# Patient Record
Sex: Female | Born: 1935 | Race: White | Hispanic: No | Smoking: Never smoker
Health system: Southern US, Community
[De-identification: ages and names within clinical notes are randomized; demographics above are authoritative.]

## PROBLEM LIST (undated history)

## (undated) DIAGNOSIS — C801 Malignant (primary) neoplasm, unspecified: Secondary | ICD-10-CM

## (undated) DIAGNOSIS — R0600 Dyspnea, unspecified: Secondary | ICD-10-CM

## (undated) DIAGNOSIS — N959 Unspecified menopausal and perimenopausal disorder: Secondary | ICD-10-CM

## (undated) DIAGNOSIS — K635 Polyp of colon: Secondary | ICD-10-CM

## (undated) DIAGNOSIS — E785 Hyperlipidemia, unspecified: Secondary | ICD-10-CM

## (undated) DIAGNOSIS — I10 Essential (primary) hypertension: Secondary | ICD-10-CM

## (undated) DIAGNOSIS — E559 Vitamin D deficiency, unspecified: Secondary | ICD-10-CM

## (undated) DIAGNOSIS — M1990 Unspecified osteoarthritis, unspecified site: Secondary | ICD-10-CM

## (undated) DIAGNOSIS — F0280 Dementia in other diseases classified elsewhere without behavioral disturbance: Secondary | ICD-10-CM

## (undated) DIAGNOSIS — N3090 Cystitis, unspecified without hematuria: Secondary | ICD-10-CM

## (undated) DIAGNOSIS — J302 Other seasonal allergic rhinitis: Secondary | ICD-10-CM

## (undated) DIAGNOSIS — G20 Parkinson's disease: Secondary | ICD-10-CM

## (undated) DIAGNOSIS — R2681 Unsteadiness on feet: Secondary | ICD-10-CM

## (undated) DIAGNOSIS — K5730 Diverticulosis of large intestine without perforation or abscess without bleeding: Secondary | ICD-10-CM

## (undated) DIAGNOSIS — R251 Tremor, unspecified: Secondary | ICD-10-CM

## (undated) DIAGNOSIS — R413 Other amnesia: Secondary | ICD-10-CM

## (undated) DIAGNOSIS — K5909 Other constipation: Secondary | ICD-10-CM

## (undated) HISTORY — DX: Malignant (primary) neoplasm, unspecified: C80.1

## (undated) HISTORY — DX: Other constipation: K59.09

## (undated) HISTORY — DX: Unsteadiness on feet: R26.81

## (undated) HISTORY — DX: Dementia in other diseases classified elsewhere, unspecified severity, without behavioral disturbance, psychotic disturbance, mood disturbance, and anxiety: F02.80

## (undated) HISTORY — DX: Unspecified osteoarthritis, unspecified site: M19.90

## (undated) HISTORY — DX: Cystitis, unspecified without hematuria: N30.90

## (undated) HISTORY — DX: Vitamin D deficiency, unspecified: E55.9

## (undated) HISTORY — DX: Dyspnea, unspecified: R06.00

## (undated) HISTORY — DX: Other amnesia: R41.3

## (undated) HISTORY — DX: Polyp of colon: K63.5

## (undated) HISTORY — DX: Tremor, unspecified: R25.1

## (undated) HISTORY — DX: Hyperlipidemia, unspecified: E78.5

## (undated) HISTORY — DX: Parkinson's disease: G20

## (undated) HISTORY — DX: Other seasonal allergic rhinitis: J30.2

## (undated) HISTORY — DX: Unspecified menopausal and perimenopausal disorder: N95.9

## (undated) HISTORY — DX: Diverticulosis of large intestine without perforation or abscess without bleeding: K57.30

## (undated) HISTORY — PX: ABDOMINAL HYSTERECTOMY: SHX81

## (undated) HISTORY — DX: Essential (primary) hypertension: I10

---

## 2006-07-26 ENCOUNTER — Ambulatory Visit: Payer: Self-pay | Admitting: Family Medicine

## 2006-08-09 ENCOUNTER — Ambulatory Visit: Payer: Self-pay | Admitting: General Surgery

## 2007-08-30 ENCOUNTER — Ambulatory Visit: Payer: Self-pay | Admitting: General Surgery

## 2012-06-21 ENCOUNTER — Ambulatory Visit: Payer: Self-pay | Admitting: Neurology

## 2013-02-14 ENCOUNTER — Ambulatory Visit: Payer: Self-pay | Admitting: Family Medicine

## 2013-03-07 ENCOUNTER — Ambulatory Visit: Payer: Self-pay | Admitting: Family Medicine

## 2013-07-15 ENCOUNTER — Encounter: Payer: Self-pay | Admitting: Pulmonary Disease

## 2013-07-15 ENCOUNTER — Ambulatory Visit (INDEPENDENT_AMBULATORY_CARE_PROVIDER_SITE_OTHER): Payer: Medicare Other | Admitting: Pulmonary Disease

## 2013-07-15 VITALS — BP 132/84 | HR 62 | Ht 59.0 in | Wt 158.0 lb

## 2013-07-15 DIAGNOSIS — J8410 Pulmonary fibrosis, unspecified: Secondary | ICD-10-CM

## 2013-07-15 DIAGNOSIS — J849 Interstitial pulmonary disease, unspecified: Secondary | ICD-10-CM

## 2013-07-15 DIAGNOSIS — J84111 Idiopathic interstitial pneumonia, not otherwise specified: Secondary | ICD-10-CM

## 2013-07-15 NOTE — Progress Notes (Signed)
Subjective:    Patient ID: Cynthia Patterson, female    DOB: 05/18/1928, 79 y.o.   MRN: [id number]  HPI  This is a very pleasant 79 year old female who comes to our clinic today for evaluation of idiopathic interstitial pneumonitis. She states that she never smoked cigarettes and she's never been told that she has any respiratory problems. She had a normal childhood and has never had significant shortness of breath over the years. However in the last couple years she has had a cough which is typically worse at night. Usually she'll note it when she gets up in the middle the night to go to the bathroom. She does sometimes feel some shortness of breath when she gets up in the middle the night. She sometimes feels hoarse afterwards. Shortness of breath is usually little worse when she lays down flat at night. She's had chronic ankle swelling which hasn't changed much in the last several years. Despite the nighttime dyspnea she really doesn't feel much shortness of breath during the day. She says that she goes to the grocery store cleans the house and is capable of carrying heavy objects about feeling too short of breath. She doesn't climb stairs very often. She can run a vacuum cleaner without having shortness of breath.   Her primary care physician noted crackles on physical exam it's ordered a chest x-ray which showed increased interstitial changes. A repeat chest x-ray was performed approximately one month later and showed that the interstitial changes were perhaps more prominent so therefore the patient was referred to [place] for further evaluation.  She doesn't have trouble swallowing and has not noted a new rash or joint problems.   Past Medical History  Diagnosis Date  . Tremor   . Dyspnea   . Unsteady gait   . Seasonal allergies   . Memory loss   . Diverticulosis of colon   . Arthritis   . Cystitis   . Vitamin D deficiency   . Colon polyp   . Cancer   . Hypertension   . Hyperlipidemia   .  Menopausal disorder      Family History  Problem Relation Age of Onset  . Heart failure Mother   . Lung cancer Father   . Hypertension Sister   . Cancer Paternal Grandmother      History   Social History  . Marital Status: Married    Spouse Name: N/A    Number of Children: N/A  . Years of Education: N/A   Occupational History  . Not on file.   Social History Main Topics  . Smoking status: Never Smoker   . Smokeless tobacco: Never Used  . Alcohol Use: No  . Drug Use: No  . Sexual Activity: Not on file   Other Topics Concern  . Not on file   Social History Narrative  . No narrative on file     Allergies  Allergen Reactions  . Lisinopril   . Norvasc [Amlodipine Besylate]      No outpatient prescriptions prior to visit.   No facility-administered medications prior to visit.      Review of Systems  Constitutional: Negative for fever, chills, diaphoresis, activity change, appetite change, fatigue and unexpected weight change.  HENT: Positive for dental problem. Negative for hearing loss, ear pain, nosebleeds, congestion, sore throat, facial swelling, rhinorrhea, sneezing, mouth sores, trouble swallowing, neck pain, neck stiffness, voice change, postnasal drip, sinus pressure, tinnitus and ear discharge.   Eyes: Negative for photophobia, discharge,  itching and visual disturbance.  Respiratory: Positive for cough and shortness of breath. Negative for apnea, choking, chest tightness, wheezing and stridor.   Cardiovascular: Negative for chest pain, palpitations and leg swelling.  Gastrointestinal: Negative for nausea, vomiting, abdominal pain, constipation, blood in stool and abdominal distention.  Genitourinary: Negative for dysuria, urgency, frequency, hematuria, flank pain, decreased urine volume and difficulty urinating.  Musculoskeletal: Negative for myalgias, back pain, joint swelling, arthralgias and gait problem.  Skin: Negative for color change, pallor and  rash.  Neurological: Negative for dizziness, tremors, seizures, syncope, speech difficulty, weakness, light-headedness, numbness and headaches.  Hematological: Negative for adenopathy. Does not bruise/bleed easily.  Psychiatric/Behavioral: Negative for confusion, sleep disturbance and agitation. The patient is nervous/anxious.        Objective:   Physical Exam  Filed Vitals:   07/15/13 1617  BP: 132/84  Pulse: 62  Height: 4\' 11"  (1.499 m)  Weight: 158 lb (71.668 kg)  SpO2: 97%   Did not desaturate after walking 569feet in the office today on RA   Gen: well appearing, no acute distress HEENT: NCAT, PERRL, EOMi, OP clear, neck supple without masses PULM: Crackles in lung bases bilaterally CV: RRR, slight systolic murmur, no JVD AB: BS+, soft, nontender, no hsm Ext: warm, ankle edema bilaterally, no clubbing, no cyanosis Derm: no rash or skin breakdown Neuro: A&Ox4, CN II-XII intact, strength 5/5 in all 4 extremities      Assessment & Plan:   Idiopathic interstitial pneumonia The combination of inspiratory crackles and an abnormal chest x-ray to make me concerned for interstitial lung disease. The differential diagnosis would include hydrostatic pulmonary edema from a cardiac cause, however she appears to be euvolemic on physical exam today.  The next up in her workup should be further diagnostic imaging and pulmonary function testing to further clarify what is going on.   Plan: -CT chest, ILD protocol in [facility] -Full pulmonary function testing -If there is no evidence of interstitial lung disease then would order an echocardiogram prior to the next visit    Updated Medication List Outpatient Encounter Prescriptions as of 07/15/2013  Medication Sig Dispense Refill  . Ascorbic Acid (VITAMIN C) 100 MG tablet Take 100 mg by mouth daily.      Marland Kitchen aspirin 81 MG tablet Take 81 mg by mouth daily.      Marland Kitchen CALCIUM-MAGNESIUM-ZINC PO Take 1 tablet by mouth daily.      .  cholecalciferol (VITAMIN D) 1000 UNITS tablet Take 1,000 Units by mouth daily.      Marland Kitchen losartan-hydrochlorothiazide (HYZAAR) 100-25 MG per tablet Take 1 tablet by mouth daily.      Marland Kitchen lovastatin (MEVACOR) 40 MG tablet Take 40 mg by mouth at bedtime.      . nebivolol (BYSTOLIC) 5 MG tablet Take 5 mg by mouth daily.      . niacin (NIASPAN) 500 MG CR tablet Take 1,000 mg by mouth at bedtime.      . Selenium 100 MCG TABS Take 1 tablet by mouth daily.      . vitamin B-12 (CYANOCOBALAMIN) 100 MCG tablet Take 50 mcg by mouth daily.       No facility-administered encounter medications on file as of 07/15/2013.

## 2013-07-15 NOTE — Assessment & Plan Note (Addendum)
The combination of inspiratory crackles and an abnormal chest x-ray to make me concerned for interstitial lung disease. The differential diagnosis would include hydrostatic pulmonary edema from a cardiac cause, however she appears to be euvolemic on physical exam today.  The next up in her workup should be further diagnostic imaging and pulmonary function testing to further clarify what is going on.   Plan: -CT chest, ILD protocol in [facility] -Full pulmonary function testing -If there is no evidence of interstitial lung disease then would order an echocardiogram prior to the next visit

## 2013-07-15 NOTE — Patient Instructions (Signed)
We will order a CT scan of your chest to evaluate the crackles and possible scarring in your lungs (in [place]) We will set up pulmonary function testing for you at [facility] We will see you back in 2-3 weeks or sooner if needed

## 2013-07-25 ENCOUNTER — Ambulatory Visit (HOSPITAL_COMMUNITY)
Admission: RE | Admit: 2013-07-25 | Discharge: 2013-07-25 | Disposition: A | Discharge status: Home / Self Care | Payer: Medicare Other | Source: Ambulatory Visit | Attending: Pulmonary Disease | Admitting: Pulmonary Disease

## 2013-07-25 ENCOUNTER — Ambulatory Visit (INDEPENDENT_AMBULATORY_CARE_PROVIDER_SITE_OTHER)
Admission: RE | Admit: 2013-07-25 | Discharge: 2013-07-25 | Disposition: A | Discharge status: Home / Self Care | Payer: Medicare Other | Source: Ambulatory Visit | Attending: Pulmonary Disease | Admitting: Pulmonary Disease

## 2013-07-25 DIAGNOSIS — J8410 Pulmonary fibrosis, unspecified: Secondary | ICD-10-CM | POA: Insufficient documentation

## 2013-07-25 DIAGNOSIS — J84111 Idiopathic interstitial pneumonia, not otherwise specified: Secondary | ICD-10-CM

## 2013-07-25 DIAGNOSIS — J849 Interstitial pulmonary disease, unspecified: Secondary | ICD-10-CM

## 2013-07-25 LAB — PULMONARY FUNCTION TEST

## 2013-07-25 MED ORDER — ALBUTEROL SULFATE (5 MG/ML) 0.5% IN NEBU
2.5000 mg | INHALATION_SOLUTION | Freq: Once | RESPIRATORY_TRACT | Status: AC
  Administered 2013-07-25: 2.5 mg via RESPIRATORY_TRACT

## 2013-07-28 ENCOUNTER — Telehealth: Payer: Self-pay | Admitting: *Deleted

## 2013-07-28 ENCOUNTER — Encounter: Payer: Self-pay | Admitting: Pulmonary Disease

## 2013-07-28 DIAGNOSIS — R0602 Shortness of breath: Secondary | ICD-10-CM

## 2013-07-28 NOTE — Telephone Encounter (Signed)
Pt is aware of CT results. Echo will be ordered.

## 2013-07-28 NOTE — Telephone Encounter (Signed)
Message copied by Caryl Ada on Mon Jul 28, 2013  9:18 AM ------      Message from: Max Fickle B      Created: Mon Jul 28, 2013  9:13 AM       L,            Please let her know that her CT did not show scarring or fibrosis and that we need to order an echocardiogram to evaluate her shortness of breath.            Thanks,      B ------

## 2013-08-11 ENCOUNTER — Ambulatory Visit: Payer: Self-pay | Admitting: Ophthalmology

## 2013-08-11 LAB — POTASSIUM: Potassium: 3.8 mmol/L (ref 3.5–5.1)

## 2013-08-14 ENCOUNTER — Telehealth: Payer: Self-pay | Admitting: Pulmonary Disease

## 2013-08-14 NOTE — Telephone Encounter (Signed)
Pt last visit on 07-15-13. Received a call from Alvino Chapel from [facility]. The pt is scheduled for cataract surgery on 08-19-13 with topical, local anesthesia. I see at last visit you ordered PFT and CT before follow-up. CT results are in epic, and PFT was done on 08-09-13. Please advise on surgical clearance, does the pt need an OV before? Carron Curie, CMA Allergies  Allergen Reactions  . Lisinopril   . Norvasc [Amlodipine Besylate]

## 2013-08-14 NOTE — Telephone Encounter (Signed)
OK for surgery if topical, local anesthesia

## 2013-08-15 ENCOUNTER — Other Ambulatory Visit: Payer: Self-pay

## 2013-08-15 ENCOUNTER — Telehealth: Payer: Self-pay | Admitting: Pulmonary Disease

## 2013-08-15 ENCOUNTER — Other Ambulatory Visit (INDEPENDENT_AMBULATORY_CARE_PROVIDER_SITE_OTHER): Payer: Medicare Other

## 2013-08-15 ENCOUNTER — Encounter: Payer: Self-pay | Admitting: *Deleted

## 2013-08-15 DIAGNOSIS — R0609 Other forms of dyspnea: Secondary | ICD-10-CM

## 2013-08-15 DIAGNOSIS — R0602 Shortness of breath: Secondary | ICD-10-CM

## 2013-08-15 NOTE — Telephone Encounter (Signed)
Spoke with Alvino Chapel at [facility] I have notified her of recs per BQ She verbalized understanding Letter was faxed to her at [phone]  Nothing further needed

## 2013-08-15 NOTE — Telephone Encounter (Signed)
Results have not even showed up yet, the pt just had this done an hour ago Dr. Kendrick Fries, please advise once you have reviewed so that we can fax this to them  Thanks!

## 2013-08-17 ENCOUNTER — Encounter: Payer: Self-pay | Admitting: Pulmonary Disease

## 2013-08-17 NOTE — Telephone Encounter (Signed)
Will do, thanks

## 2013-08-18 ENCOUNTER — Telehealth: Payer: Self-pay | Admitting: Pulmonary Disease

## 2013-08-18 NOTE — Telephone Encounter (Addendum)
Notes Recorded by Lupita Leash, MD on 08/17/2013 at 3:42 PM L,  Please let her know that her echo was essentially normal. There were a few minor abnormalities which are hard to describe on the phone and we will discuss it when she comes in next.  Thanks, B   Pt advised of results. Carron Curie, CMA

## 2013-08-19 ENCOUNTER — Ambulatory Visit: Payer: Self-pay | Admitting: Ophthalmology

## 2013-08-19 ENCOUNTER — Encounter: Payer: Self-pay | Admitting: Pulmonary Disease

## 2013-08-19 NOTE — Telephone Encounter (Signed)
Spoke with Melissa at [facility] in [place].  Pt is scheduled to have a cataract extration.  They are needing the echo results.  I have faxed this to the verified fax #.  Melissa aware.

## 2013-09-02 ENCOUNTER — Ambulatory Visit: Payer: Self-pay | Admitting: Ophthalmology

## 2015-01-15 DIAGNOSIS — I951 Orthostatic hypotension: Secondary | ICD-10-CM | POA: Diagnosis not present

## 2015-01-15 DIAGNOSIS — L821 Other seborrheic keratosis: Secondary | ICD-10-CM | POA: Diagnosis not present

## 2015-01-15 DIAGNOSIS — I10 Essential (primary) hypertension: Secondary | ICD-10-CM | POA: Diagnosis not present

## 2015-01-15 DIAGNOSIS — E785 Hyperlipidemia, unspecified: Secondary | ICD-10-CM | POA: Diagnosis not present

## 2015-02-26 NOTE — Op Note (Signed)
PATIENT NAME:  Cynthia Patterson, Cynthia Patterson MR#:  [id number] DATE OF BIRTH:  [birth year]  DATE OF PROCEDURE:  09/02/2013  PREOPERATIVE DIAGNOSIS: Visually significant cataract of the right eye.   POSTOPERATIVE DIAGNOSIS: Visually significant cataract of the right eye.   OPERATIVE PROCEDURE: Cataract extraction by phacoemulsification with implant of intraocular lens to right eye.   SURGEON: Birder Robson, MD.   ANESTHESIA:  1. Managed anesthesia care.  2. Topical tetracaine drops followed by 2% Xylocaine jelly applied in the preoperative holding area.   COMPLICATIONS: None.   TECHNIQUE:   Stop and chop.   DESCRIPTION OF PROCEDURE: The patient was examined and consented in the preoperative holding area where the aforementioned topical anesthesia was applied to the right eye and then brought back to the Operating Room where the right eye was prepped and draped in the usual sterile ophthalmic fashion and a lid speculum was placed. A paracentesis was created with the side port blade and the anterior chamber was filled with viscoelastic. A near clear corneal incision was performed with the steel keratome. A continuous curvilinear capsulorrhexis was performed with a cystotome followed by the capsulorrhexis forceps. Hydrodissection and hydrodelineation were carried out with BSS on a blunt cannula. The lens was removed in a  Stop and chop technique and the remaining cortical material was removed with the irrigation-aspiration handpiece. The capsular bag was inflated with viscoelastic and the Tecnis ZCB00 24.0-diopter lens, serial #[id number], was placed in the capsular bag without complication. The remaining viscoelastic was removed from the eye with the irrigation-aspiration handpiece. The wounds were hydrated. The anterior chamber was flushed with Miostat and the eye was inflated to physiologic pressure. 0.1 mL of cefuroxime concentration 10 mg/mL was placed in the anterior chamber. The wounds were found to be water  tight. The eye was dressed with Vigamox. The patient was given protective glasses to wear throughout the day and a shield with which to sleep tonight. The patient was also given drops with which to begin a drop regimen today and will follow-up with me in one day.    ____________________________ Livingston Diones. Kecia Swoboda, MD wlp:np D: 09/02/2013 18:42:08 ET T: 09/02/2013 19:39:52 ET JOB#: [id number]  cc: Ricardo Kayes L. Katherine Tout, MD, <Dictator> Livingston Diones [place] Severs MD ELECTRONICALLY SIGNED 09/08/2013 10:30

## 2015-02-26 NOTE — Op Note (Signed)
PATIENT NAME:  Cynthia Patterson, MACLIN MR#:  [id number] DATE OF BIRTH:  1928-02-02  DATE OF PROCEDURE:  08/19/2013  PREOPERATIVE DIAGNOSIS: Visually significant cataract of the left eye.   POSTOPERATIVE DIAGNOSIS: Visually significant cataract of the left eye.   OPERATIVE PROCEDURE: Cataract extraction by phacoemulsification with implant of intraocular lens to left eye.   SURGEON: Birder Robson, MD.   ANESTHESIA:  1. Managed anesthesia care.  2. Topical tetracaine drops followed by 2% Xylocaine jelly applied in the preoperative holding area.   COMPLICATIONS: None.   TECHNIQUE:  Stop and chop.  DESCRIPTION OF PROCEDURE: The patient was examined and consented in the preoperative holding area where the aforementioned topical anesthesia was applied to the left eye and then brought back to the Operating Room where the left eye was prepped and draped in the usual sterile ophthalmic fashion and a lid speculum was placed. A paracentesis was created with the side port blade and the anterior chamber was filled with viscoelastic. A near clear corneal incision was performed with the steel keratome. A continuous curvilinear capsulorrhexis was performed with a cystotome followed by the capsulorrhexis forceps. Hydrodissection and hydrodelineation were carried out with BSS on a blunt cannula. The lens was removed in a stop and chop technique and the remaining cortical material was removed with the irrigation-aspiration handpiece. The capsular bag was inflated with viscoelastic and the Tecnis ZCB00 25.5-diopter lens, serial number [id number] was placed in the capsular bag without complication. The remaining viscoelastic was removed from the eye with the irrigation-aspiration handpiece. The wounds were hydrated. The anterior chamber was flushed with Miostat and the eye was inflated to physiologic pressure. 0.1 mL of cefuroxime concentration 10 mg/mL was placed in the anterior chamber. The wounds were found to be water  tight. The eye was dressed with Vigamox. The patient was given protective glasses to wear throughout the day and a shield with which to sleep tonight. The patient was also given drops with which to begin a drop regimen today and will follow-up with me in one day.     ____________________________ Livingston Diones. Aldahir Litaker, MD wlp:ms D: 08/19/2013 16:58:09 ET T: 08/19/2013 18:38:49 ET JOB#: [id number]  cc: Casaundra Takacs L. Gillian Kluever, MD, <Dictator> Livingston Diones Raniah Karan MD ELECTRONICALLY SIGNED 08/25/2013 13:18

## 2015-04-26 ENCOUNTER — Other Ambulatory Visit: Payer: Self-pay | Admitting: Family Medicine

## 2015-04-26 DIAGNOSIS — I10 Essential (primary) hypertension: Secondary | ICD-10-CM

## 2015-05-26 ENCOUNTER — Other Ambulatory Visit: Payer: Self-pay | Admitting: Family Medicine

## 2015-05-26 DIAGNOSIS — I10 Essential (primary) hypertension: Secondary | ICD-10-CM

## 2015-05-29 ENCOUNTER — Other Ambulatory Visit: Payer: Self-pay | Admitting: Family Medicine

## 2015-05-29 DIAGNOSIS — I10 Essential (primary) hypertension: Secondary | ICD-10-CM

## 2015-06-26 ENCOUNTER — Other Ambulatory Visit: Payer: Self-pay | Admitting: Family Medicine

## 2015-07-22 ENCOUNTER — Encounter: Payer: Self-pay | Admitting: Family Medicine

## 2015-08-31 ENCOUNTER — Other Ambulatory Visit: Payer: Self-pay | Admitting: Family Medicine

## 2015-08-31 DIAGNOSIS — I10 Essential (primary) hypertension: Secondary | ICD-10-CM

## 2015-08-31 NOTE — Telephone Encounter (Signed)
Last OV 01/15/2015; overdue for AWV. Renaldo Fiddler, CMA

## 2015-10-05 ENCOUNTER — Other Ambulatory Visit: Payer: Self-pay | Admitting: Family Medicine

## 2015-10-05 DIAGNOSIS — I10 Essential (primary) hypertension: Secondary | ICD-10-CM

## 2015-10-18 ENCOUNTER — Other Ambulatory Visit: Payer: Self-pay | Admitting: Family Medicine

## 2015-10-18 DIAGNOSIS — I10 Essential (primary) hypertension: Secondary | ICD-10-CM

## 2015-10-18 NOTE — Telephone Encounter (Signed)
Last OV 01/2015  Thanks,   -Fatimata Talsma  

## 2015-11-01 ENCOUNTER — Other Ambulatory Visit: Payer: Self-pay | Admitting: Family Medicine

## 2015-11-04 ENCOUNTER — Other Ambulatory Visit: Payer: Self-pay | Admitting: Family Medicine

## 2015-11-04 DIAGNOSIS — I10 Essential (primary) hypertension: Secondary | ICD-10-CM

## 2015-11-05 NOTE — Telephone Encounter (Signed)
LOV 01/15/2015

## 2016-01-26 ENCOUNTER — Other Ambulatory Visit: Payer: Self-pay | Admitting: Family Medicine

## 2016-01-26 DIAGNOSIS — I10 Essential (primary) hypertension: Secondary | ICD-10-CM

## 2016-02-23 ENCOUNTER — Other Ambulatory Visit: Payer: Self-pay | Admitting: Family Medicine

## 2016-02-23 DIAGNOSIS — I10 Essential (primary) hypertension: Secondary | ICD-10-CM

## 2016-02-23 NOTE — Telephone Encounter (Signed)
01/15/15 last ov

## 2016-03-21 ENCOUNTER — Other Ambulatory Visit: Payer: Self-pay | Admitting: Family Medicine

## 2016-03-21 DIAGNOSIS — I10 Essential (primary) hypertension: Secondary | ICD-10-CM

## 2016-04-15 ENCOUNTER — Other Ambulatory Visit: Payer: Self-pay | Admitting: Family Medicine

## 2016-04-15 DIAGNOSIS — E785 Hyperlipidemia, unspecified: Secondary | ICD-10-CM

## 2016-04-29 ENCOUNTER — Other Ambulatory Visit: Payer: Self-pay | Admitting: Family Medicine

## 2016-05-14 ENCOUNTER — Other Ambulatory Visit: Payer: Self-pay | Admitting: Family Medicine

## 2016-08-23 ENCOUNTER — Other Ambulatory Visit: Payer: Self-pay | Admitting: Family Medicine

## 2016-08-23 DIAGNOSIS — I10 Essential (primary) hypertension: Secondary | ICD-10-CM

## 2016-09-07 ENCOUNTER — Other Ambulatory Visit: Payer: Self-pay | Admitting: Internal Medicine

## 2016-09-07 DIAGNOSIS — N184 Chronic kidney disease, stage 4 (severe): Secondary | ICD-10-CM

## 2016-09-22 ENCOUNTER — Other Ambulatory Visit: Payer: Self-pay | Admitting: Family Medicine

## 2016-09-22 DIAGNOSIS — I10 Essential (primary) hypertension: Secondary | ICD-10-CM

## 2016-10-26 ENCOUNTER — Other Ambulatory Visit: Payer: Self-pay | Admitting: Family Medicine

## 2016-10-26 DIAGNOSIS — I10 Essential (primary) hypertension: Secondary | ICD-10-CM

## 2016-11-09 ENCOUNTER — Other Ambulatory Visit: Payer: Self-pay | Admitting: Family Medicine

## 2016-11-09 DIAGNOSIS — I10 Essential (primary) hypertension: Secondary | ICD-10-CM

## 2018-04-07 ENCOUNTER — Other Ambulatory Visit: Payer: Self-pay

## 2018-04-07 ENCOUNTER — Emergency Department: Payer: Medicare Other

## 2018-04-07 ENCOUNTER — Emergency Department
Admission: EM | Admit: 2018-04-07 | Discharge: 2018-04-07 | Disposition: A | Discharge status: Home / Self Care | Payer: Medicare Other | Attending: Emergency Medicine | Admitting: Emergency Medicine

## 2018-04-07 DIAGNOSIS — S0101XA Laceration without foreign body of scalp, initial encounter: Secondary | ICD-10-CM | POA: Insufficient documentation

## 2018-04-07 DIAGNOSIS — S0990XA Unspecified injury of head, initial encounter: Secondary | ICD-10-CM | POA: Diagnosis present

## 2018-04-07 DIAGNOSIS — Z7982 Long term (current) use of aspirin: Secondary | ICD-10-CM | POA: Diagnosis not present

## 2018-04-07 DIAGNOSIS — W2209XA Striking against other stationary object, initial encounter: Secondary | ICD-10-CM | POA: Insufficient documentation

## 2018-04-07 DIAGNOSIS — Y999 Unspecified external cause status: Secondary | ICD-10-CM | POA: Insufficient documentation

## 2018-04-07 DIAGNOSIS — Z79899 Other long term (current) drug therapy: Secondary | ICD-10-CM | POA: Diagnosis not present

## 2018-04-07 DIAGNOSIS — I10 Essential (primary) hypertension: Secondary | ICD-10-CM | POA: Insufficient documentation

## 2018-04-07 DIAGNOSIS — Y929 Unspecified place or not applicable: Secondary | ICD-10-CM | POA: Diagnosis not present

## 2018-04-07 DIAGNOSIS — Y9389 Activity, other specified: Secondary | ICD-10-CM | POA: Diagnosis not present

## 2018-04-07 NOTE — ED Provider Notes (Signed)
[facility] Emergency Department Provider Note   ____________________________________________   First MD Initiated Contact with Patient 04/07/18 [phone]     (approximate)  I have reviewed the triage vital signs and the nursing notes.   HISTORY  Chief Complaint Head Injury    HPI Cynthia Patterson is a 84 y.o. female brought to the ED from home via EMS status post mechanical fall with head injury.  Patient states she was reaching for something, lost her balance and fell, striking the corner of her wooden coffee table.  Denies LOC.  Denies use of anticoagulants.  Presents with dried blood on her left upper scalp.  Denies headache, neck pain, chest pain, shortness of breath, abdominal pain, nausea or vomiting.   Past Medical History:  Diagnosis Date  . Arthritis   . Cancer   . Colon polyp   . Cystitis   . Diverticulosis of colon   . Dyspnea   . Hyperlipidemia   . Hypertension   . Memory loss   . Menopausal disorder   . Seasonal allergies   . Tremor   . Unsteady gait   . Vitamin D deficiency     Patient Active Problem List   Diagnosis Date Noted  . Hypertension 04/26/2015  . Idiopathic interstitial pneumonia ([place]) 07/15/2013  . HLD (hyperlipidemia) 03/23/2009     Prior to Admission medications   Medication Sig Start Date End Date Taking? Authorizing Provider  Ascorbic Acid (VITAMIN C) 100 MG tablet Take 100 mg by mouth daily.    [provider]  aspirin 81 MG tablet Take 81 mg by mouth daily.    [provider]  BYSTOLIC 10 MG tablet TAKE 1 TABLET BY MOUTH ONCE A DAY 10/18/15   Margarita Rana, MD  CALCIUM-MAGNESIUM-ZINC PO Take 1 tablet by mouth daily.    [provider]  cholecalciferol (VITAMIN D) 1000 UNITS tablet Take 1,000 Units by mouth daily.    [provider]  losartan-hydrochlorothiazide (HYZAAR) 100-25 MG tablet TAKE 1 TABLET BY MOUTH EVERY DAY 09/22/16   Birdie Sons, MD  lovastatin (MEVACOR) 40  MG tablet Take 1 tablet (40 mg total) by mouth daily. Pt needs to schedule an office visit before anymore refills. 04/16/16   Margarita Rana, MD  nebivolol (BYSTOLIC) 10 MG tablet Take 1 tablet (10 mg total) by mouth daily. Pt needs to schedule an office visit before anymore refills. 03/21/16   Margarita Rana, MD  nebivolol (BYSTOLIC) 5 MG tablet Take 5 mg by mouth daily.    [provider]  niacin (NIASPAN) 500 MG CR tablet Take 1,000 mg by mouth at bedtime.    [provider]  Selenium 100 MCG TABS Take 1 tablet by mouth daily.    [provider]  vitamin B-12 (CYANOCOBALAMIN) 100 MCG tablet Take 50 mcg by mouth daily.    [provider]    Allergies Lisinopril and Norvasc [amlodipine besylate]  Family History  Problem Relation Age of Onset  . Heart failure Mother   . Lung cancer Father   . Hypertension Sister   . Cancer Paternal Grandmother     Social History Social History   Tobacco Use  . Smoking status: Never Smoker  . Smokeless tobacco: Never Used  Substance Use Topics  . Alcohol use: No  . Drug use: No    Review of Systems  Constitutional: No fever/chills Eyes: No visual changes. ENT: No sore throat. Cardiovascular: Denies chest pain. Respiratory: Denies shortness of breath.  Gastrointestinal: No abdominal pain.  No nausea, no vomiting.  No diarrhea.  No constipation. Genitourinary: Negative for dysuria. Musculoskeletal: Negative for back pain. Skin: Negative for rash. Neurological: Positive for head injury with scalp laceration.  Negative for headaches, focal weakness or numbness.   ____________________________________________   PHYSICAL EXAM:  VITAL SIGNS: ED Triage Vitals  Enc Vitals Group     BP      Pulse      Resp      Temp      Temp src      SpO2      Weight      Height      Head Circumference      Peak Flow      Pain Score      Pain Loc      Pain Edu?      Excl. in [place]?     Constitutional: Alert and  oriented. Well appearing and in no acute distress. Eyes: Conjunctivae are normal. PERRL. EOMI. Head: Hair matted and covered with dried blood.  Appears to have laceration to left parietal scalp.  Will reexamine after patient is cleaned up. Nose: No congestion/rhinnorhea. Mouth/Throat: Mucous membranes are moist.  Oropharynx non-erythematous. Neck: No stridor.  No cervical spine tenderness to palpation. Cardiovascular: Normal rate, regular rhythm. Grossly normal heart sounds.  Good peripheral circulation. Respiratory: Normal respiratory effort.  No retractions. Lungs CTAB. Gastrointestinal: Soft and nontender. No distention. No abdominal bruits. No CVA tenderness. Musculoskeletal: No lower extremity tenderness nor edema.  No joint effusions. Neurologic: Alert and oriented to person and place.  Normal speech and language. No gross focal neurologic deficits are appreciated. MAEx4. Skin:  Skin is warm, dry and intact. No rash noted. Psychiatric: Mood and affect are normal. Speech and behavior are normal.  ____________________________________________   LABS (all labs ordered are listed, but only abnormal results are displayed)  Labs Reviewed - No data to display ____________________________________________  EKG  None ____________________________________________  RADIOLOGY  ED MD interpretation: No ICH  Official radiology report(s): Ct Head Wo Contrast  Result Date: 04/07/2018 CLINICAL DATA:  Status post fall. Loss of balance. Scalp laceration. Concern for head injury. EXAM: CT HEAD WITHOUT CONTRAST TECHNIQUE: Contiguous axial images were obtained from the base of the skull through the vertex without intravenous contrast. COMPARISON:  MRI of the brain performed 06/21/2012 FINDINGS: Brain: No evidence of acute infarction, hemorrhage, hydrocephalus, extra-axial collection or mass lesion / mass effect. Prominence of the ventricles and sulci reflects mild to moderate cortical volume loss. Mild  cerebellar atrophy is noted. A small chronic lacunar infarct is noted at the right cerebellar hemisphere. The brainstem and fourth ventricle are within normal limits. The basal ganglia are unremarkable in appearance. The cerebral hemispheres demonstrate grossly normal gray-white differentiation. No mass effect or midline shift is seen. Vascular: No hyperdense vessel or unexpected calcification. Skull: There is no evidence of fracture; visualized osseous structures are unremarkable in appearance. Sinuses/Orbits: The visualized portions of the orbits are within normal limits. The paranasal sinuses and mastoid air cells are well-aerated. Other: A soft tissue laceration is noted at the vertex. IMPRESSION: 1. No evidence of traumatic intracranial injury or fracture. 2. Mild to moderate cortical volume loss. Small chronic lacunar infarct at the right cerebellar hemisphere. 3. Soft tissue laceration at the vertex. Electronically Signed   By: Garald Balding M.D.   On: 04/07/2018 01:56    ____________________________________________   PROCEDURES  Procedure(s) performed:     Marland KitchenMarland KitchenLaceration Repair Date/Time: 04/07/2018  2:10 AM Performed by: Paulette Blanch, MD Authorized by: Paulette Blanch, MD   Consent:    Consent obtained:  Verbal   Consent given by:  Patient and guardian   Risks discussed:  Pain, poor cosmetic result and poor wound healing Anesthesia (see MAR for exact dosages):    Anesthesia method:  Topical application   Topical anesthesia: Pain-ease. Laceration details:    Location:  Scalp   Length (cm):  4 Repair type:    Repair type:  Simple Exploration:    Hemostasis achieved with:  Direct pressure   Wound exploration: entire depth of wound probed and visualized     Contaminated: no   Treatment:    Area cleansed with:  Saline (Hydrogen peroxide)   Amount of cleaning:  Extensive   Irrigation solution:  Sterile saline   Irrigation method:  Pressure wash   Visualized foreign bodies/material  removed: no   Skin repair:    Repair method:  Sutures and staples   Number of staples:  7 Approximation:    Approximation:  Close Post-procedure details:    Dressing: pressure dressing.   Patient tolerance of procedure:  Tolerated well, no immediate complications    Critical Care performed: No  ____________________________________________   INITIAL IMPRESSION / ASSESSMENT AND PLAN / ED COURSE  As part of my medical decision making, I reviewed the following data within the [facility] notes reviewed and incorporated, Old chart reviewed, Radiograph reviewed and Notes from prior ED visits   84 year old female who presents with mechanical fall with scalp laceration.  Differential diagnosis includes but is not limited to [place], scalp laceration, CVA, etc.  Will clean scalp and reexamined wound.  Will send for CT head to evaluate for intracranial hemorrhage.  Clinical Course as of Apr 07 249  Sun Apr 07, 2018  0209 Wound examined after extensive cleaning by nurse.  On the vertex of the scalp, there is a linear approximately 3 cm laceration without active bleeding.  Proximal to that there is an approximately 1 cm macerated area with active bleeding.  Patient tolerated staples well.  Hemostasis achieved.  Will apply pressure dressing and reassess.  Updated patient and family members of CT results.  Will take down dressing in about 15 to 20 minutes to reassess.   [JS]  6812 Dressing taken down and wound reexamined.  There is no bleeding.  Will redress and prepare for discharge home.  Strict return precautions given.  Patient and family members verbalize understanding and agree with plan of care.   [JS]    Clinical Course User Index [JS] Paulette Blanch, MD     ____________________________________________   FINAL CLINICAL IMPRESSION(S) / ED DIAGNOSES  Final diagnoses:  Minor head injury, initial encounter  Laceration of scalp, initial encounter     ED  Discharge Orders    None       Note:  This document was prepared using Dragon voice recognition software and may include unintentional dictation errors.    Paulette Blanch, MD 04/07/18 [phone]

## 2018-04-07 NOTE — ED Notes (Signed)
Waiting on family to return for discharge.

## 2018-04-07 NOTE — ED Notes (Signed)
Laceration repaired by md.

## 2018-04-07 NOTE — ED Triage Notes (Addendum)
Pt from home, pt states she was looking at something on the tv, "when I just lost my balance and I fell". Pt states she struck her head on a wooden coffee table. Pt with dried blood to both arms, face, scalp. Pt with possible laceration under clotted blood and hair mix to top of scalp. Pt is alert and oriented, denies loc.

## 2018-04-07 NOTE — ED Notes (Signed)
Patient transported to CT 

## 2018-04-07 NOTE — Discharge Instructions (Addendum)
Staple removal in 7-10 days. Return to the ER for worsening symptoms, persistent vomiting, difficulty breathing or other concerns.

## 2018-06-24 ENCOUNTER — Other Ambulatory Visit: Payer: Self-pay | Admitting: Internal Medicine

## 2018-06-24 DIAGNOSIS — Z1231 Encounter for screening mammogram for malignant neoplasm of breast: Secondary | ICD-10-CM

## 2019-10-09 ENCOUNTER — Ambulatory Visit
Admission: EM | Admit: 2019-10-09 | Discharge: 2019-10-09 | Disposition: A | Discharge status: Home / Self Care | Payer: Medicare Other | Attending: Orthopedic Surgery | Admitting: Orthopedic Surgery

## 2019-10-09 ENCOUNTER — Emergency Department: Payer: Medicare Other

## 2019-10-09 ENCOUNTER — Encounter: Payer: Self-pay | Admitting: Emergency Medicine

## 2019-10-09 ENCOUNTER — Emergency Department: Payer: Medicare Other | Admitting: Anesthesiology

## 2019-10-09 ENCOUNTER — Encounter
Admission: EM | Disposition: A | Discharge status: Home / Self Care | Payer: Self-pay | Source: Home / Self Care | Attending: Emergency Medicine

## 2019-10-09 ENCOUNTER — Other Ambulatory Visit: Payer: Self-pay

## 2019-10-09 DIAGNOSIS — E785 Hyperlipidemia, unspecified: Secondary | ICD-10-CM | POA: Diagnosis not present

## 2019-10-09 DIAGNOSIS — Z8249 Family history of ischemic heart disease and other diseases of the circulatory system: Secondary | ICD-10-CM | POA: Diagnosis not present

## 2019-10-09 DIAGNOSIS — Z888 Allergy status to other drugs, medicaments and biological substances status: Secondary | ICD-10-CM | POA: Insufficient documentation

## 2019-10-09 DIAGNOSIS — S52502A Unspecified fracture of the lower end of left radius, initial encounter for closed fracture: Secondary | ICD-10-CM

## 2019-10-09 DIAGNOSIS — W1830XA Fall on same level, unspecified, initial encounter: Secondary | ICD-10-CM | POA: Diagnosis not present

## 2019-10-09 DIAGNOSIS — S52602A Unspecified fracture of lower end of left ulna, initial encounter for closed fracture: Secondary | ICD-10-CM | POA: Insufficient documentation

## 2019-10-09 DIAGNOSIS — W19XXXA Unspecified fall, initial encounter: Secondary | ICD-10-CM

## 2019-10-09 DIAGNOSIS — Z79899 Other long term (current) drug therapy: Secondary | ICD-10-CM | POA: Insufficient documentation

## 2019-10-09 DIAGNOSIS — I10 Essential (primary) hypertension: Secondary | ICD-10-CM | POA: Insufficient documentation

## 2019-10-09 DIAGNOSIS — M1990 Unspecified osteoarthritis, unspecified site: Secondary | ICD-10-CM | POA: Insufficient documentation

## 2019-10-09 DIAGNOSIS — Z20828 Contact with and (suspected) exposure to other viral communicable diseases: Secondary | ICD-10-CM | POA: Insufficient documentation

## 2019-10-09 DIAGNOSIS — Z7982 Long term (current) use of aspirin: Secondary | ICD-10-CM | POA: Insufficient documentation

## 2019-10-09 DIAGNOSIS — S62102A Fracture of unspecified carpal bone, left wrist, initial encounter for closed fracture: Secondary | ICD-10-CM

## 2019-10-09 HISTORY — PX: CLOSED REDUCTION WRIST FRACTURE: SHX1091

## 2019-10-09 HISTORY — PX: CAST APPLICATION: SHX380

## 2019-10-09 LAB — SARS CORONAVIRUS 2 BY RT PCR (HOSPITAL ORDER, PERFORMED IN ~~LOC~~ HOSPITAL LAB): SARS Coronavirus 2: NEGATIVE

## 2019-10-09 LAB — BASIC METABOLIC PANEL
Anion gap: 10 (ref 5–15)
BUN: 31 mg/dL — ABNORMAL HIGH (ref 8–23)
CO2: 25 mmol/L (ref 22–32)
Calcium: 9.7 mg/dL (ref 8.9–10.3)
Chloride: 101 mmol/L (ref 98–111)
Creatinine, Ser: 1.3 mg/dL — ABNORMAL HIGH (ref 0.44–1.00)
GFR calc Af Amer: 42 mL/min — ABNORMAL LOW (ref >60)
GFR calc non Af Amer: 36 mL/min — ABNORMAL LOW (ref >60)
Glucose, Bld: 100 mg/dL — ABNORMAL HIGH (ref 70–99)
Potassium: 3.4 mmol/L — ABNORMAL LOW (ref 3.5–5.1)
Sodium: 136 mmol/L (ref 135–145)

## 2019-10-09 LAB — CBC
HCT: 43.5 % (ref 36.0–46.0)
Hemoglobin: 14.4 g/dL (ref 12.0–15.0)
MCH: 29.8 pg (ref 26.0–34.0)
MCHC: 33.1 g/dL (ref 30.0–36.0)
MCV: 89.9 fL (ref 80.0–100.0)
Platelets: 241 10*3/uL (ref 150–400)
RBC: 4.84 MIL/uL (ref 3.87–5.11)
RDW: 14 % (ref 11.5–15.5)
WBC: 10.2 10*3/uL (ref 4.0–10.5)
nRBC: 0 % (ref 0.0–0.2)

## 2019-10-09 SURGERY — CLOSED REDUCTION, WRIST
Anesthesia: General | Site: Wrist | Laterality: Left

## 2019-10-09 MED ORDER — ONDANSETRON HCL 4 MG PO TABS: 4.0000 mg | ORAL_TABLET | Freq: Three times a day (TID) | ORAL | 0 refills | Status: DC | PRN

## 2019-10-09 MED ORDER — PROPOFOL 10 MG/ML IV BOLUS
INTRAVENOUS | Status: DC | PRN
  Administered 2019-10-09: 50 mg via INTRAVENOUS

## 2019-10-09 MED ORDER — LACTATED RINGERS IV SOLN
INTRAVENOUS | Status: DC
  Administered 2019-10-09: 09:00:00 via INTRAVENOUS

## 2019-10-09 MED ORDER — GLYCOPYRROLATE 0.2 MG/ML IJ SOLN
INTRAMUSCULAR | Status: DC | PRN
  Administered 2019-10-09: 0.2 mg via INTRAVENOUS

## 2019-10-09 MED ORDER — LACTATED RINGERS IV SOLN
INTRAVENOUS | Status: DC | PRN
  Administered 2019-10-09: 09:00:00 via INTRAVENOUS

## 2019-10-09 MED ORDER — ACETAMINOPHEN 325 MG PO TABS: 325.0000 mg | ORAL_TABLET | ORAL | Status: DC | PRN

## 2019-10-09 MED ORDER — ACETAMINOPHEN 160 MG/5ML PO SOLN
325.0000 mg | ORAL | Status: DC | PRN
  Filled 2019-10-09: qty 20.3

## 2019-10-09 MED ORDER — PROPOFOL 10 MG/ML IV BOLUS
INTRAVENOUS | Status: AC
  Filled 2019-10-09: qty 20

## 2019-10-09 MED ORDER — HYDROCODONE-ACETAMINOPHEN 5-325 MG PO TABS: 1.0000 | ORAL_TABLET | ORAL | 0 refills | Status: DC | PRN

## 2019-10-09 MED ORDER — FENTANYL CITRATE (PF) 100 MCG/2ML IJ SOLN
INTRAMUSCULAR | Status: AC
  Filled 2019-10-09: qty 2

## 2019-10-09 SURGICAL SUPPLY — 13 items
BNDG ELASTIC 4X5.8 VLCR NS LF (GAUZE/BANDAGES/DRESSINGS) IMPLANT
CAST PADDING 3X4FT ST 30246 (SOFTGOODS) ×4
COVER WAND RF STERILE (DRAPES) IMPLANT
GLOVE SURG XRAY 8.5 LX (GLOVE) ×4 IMPLANT
KIT TURNOVER KIT A (KITS) ×4 IMPLANT
PACK EXTREMITY ARMC (MISCELLANEOUS) IMPLANT
PAD CAST CTTN 3X4 STRL (SOFTGOODS) ×4 IMPLANT
PAD CAST CTTN 4X4 STRL (SOFTGOODS) IMPLANT
PADDING CAST COTTON 4X4 STRL (SOFTGOODS)
SLING ARM M TX990204 (SOFTGOODS) ×4 IMPLANT
SPLINT CAST 1 STEP 4X15 (MISCELLANEOUS) IMPLANT
SPLINT CAST 1 STEP 4X30 (MISCELLANEOUS) IMPLANT
TAPE CAST 2X4 WHT DELT NS (MISCELLANEOUS) ×20 IMPLANT

## 2019-10-09 NOTE — Anesthesia Procedure Notes (Signed)
Procedure Name: General with mask airway Date/Time: 10/09/2019 8:52 AM Performed by: Alphonsus Sias, MD Pre-anesthesia Checklist: Patient identified, Emergency Drugs available, Suction available, Patient being monitored and Timeout performed Induction Type: IV induction Ventilation: Mask ventilation without difficulty

## 2019-10-09 NOTE — Anesthesia Postprocedure Evaluation (Signed)
Anesthesia Post Note  Patient: Cynthia Patterson  Procedure(s) Performed: CLOSED REDUCTION WRIST (Left Wrist) CAST APPLICATION (Left )  Patient location during evaluation: PACU Anesthesia Type: General Level of consciousness: awake and alert Pain management: pain level controlled Vital Signs Assessment: post-procedure vital signs reviewed and stable Respiratory status: spontaneous breathing, nonlabored ventilation and respiratory function stable Cardiovascular status: blood pressure returned to baseline and stable Postop Assessment: no apparent nausea or vomiting Anesthetic complications: no     Last Vitals:  Vitals:   10/09/19 0948 10/09/19 1005  BP:  (!) 109/92  Pulse: (!) 55 (!) 58  Resp: 20 17  Temp: (!) 36.1 C (!) 36.3 C  SpO2: 95% 98%    Last Pain:  Vitals:   10/09/19 1005  TempSrc: Tympanic  PainSc: 0-No pain                 Alphonsus Sias

## 2019-10-09 NOTE — H&P (Addendum)
PREOPERATIVE H&P  Chief Complaint: Closed left distal both bone forearm fracture  HPI: Cynthia Patterson is a 85 y.o. female who presents for preoperative history and physical with a diagnosis of closed left distal both bone forearm fracture status post fall at home while putting on her pajamas.  Patient fell from a standing height.  She was noted to have immediate pain and deformity in the left wrist.  Patient is seen in the ER with her daughter.  Patient denies numbness, tingling or significant pain currently.  X-ray films of the left wrist have demonstrated a comminuted, displaced distal both bone forearm fracture.  Left humerus films are negative for fracture or dislocation.  Past Medical History:  Diagnosis Date  . Arthritis   . Cancer ([place])   . Colon polyp   . Cystitis   . Diverticulosis of colon   . Dyspnea   . Hyperlipidemia   . Hypertension   . Memory loss   . Menopausal disorder   . Seasonal allergies   . Tremor   . Unsteady gait   . Vitamin D deficiency    Past Surgical History:  Procedure Laterality Date  . ABDOMINAL HYSTERECTOMY     Social History   Socioeconomic History  . Marital status: Married    Spouse name: Not on file  . Number of children: Not on file  . Years of education: Not on file  . Highest education level: Not on file  Occupational History  . Not on file  Social Needs  . Financial resource strain: Not on file  . Food insecurity    Worry: Not on file    Inability: Not on file  . Transportation needs    Medical: Not on file    Non-medical: Not on file  Tobacco Use  . Smoking status: Never Smoker  . Smokeless tobacco: Never Used  Substance and Sexual Activity  . Alcohol use: No  . Drug use: No  . Sexual activity: Not on file  Lifestyle  . Physical activity    Days per week: Not on file    Minutes per session: Not on file  . Stress: Not on file  Relationships  . Social Herbalist on phone: Not on file    Gets together: Not on  file    Attends religious service: Not on file    Active member of club or organization: Not on file    Attends meetings of clubs or organizations: Not on file    Relationship status: Not on file  Other Topics Concern  . Not on file  Social History Narrative  . Not on file   Family History  Problem Relation Age of Onset  . Heart failure Mother   . Lung cancer Father   . Cancer Paternal Grandmother   . Hypertension Sister    Allergies  Allergen Reactions  . Lisinopril   . Norvasc [Amlodipine Besylate]    Prior to Admission medications   Medication Sig Start Date End Date Taking? Authorizing Provider  Ascorbic Acid (VITAMIN C) 100 MG tablet Take 100 mg by mouth daily.    [provider]  aspirin 81 MG tablet Take 81 mg by mouth daily.    [provider]  BYSTOLIC 10 MG tablet TAKE 1 TABLET BY MOUTH ONCE A DAY 10/18/15   Margarita Rana, MD  CALCIUM-MAGNESIUM-ZINC PO Take 1 tablet by mouth daily.    [provider]  cholecalciferol (VITAMIN D) 1000 UNITS tablet Take  1,000 Units by mouth daily.    [provider]  losartan-hydrochlorothiazide (HYZAAR) 100-25 MG tablet TAKE 1 TABLET BY MOUTH EVERY DAY 09/22/16   Birdie Sons, MD  lovastatin (MEVACOR) 40 MG tablet Take 1 tablet (40 mg total) by mouth daily. Pt needs to schedule an office visit before anymore refills. 04/16/16   Margarita Rana, MD  nebivolol (BYSTOLIC) 10 MG tablet Take 1 tablet (10 mg total) by mouth daily. Pt needs to schedule an office visit before anymore refills. 03/21/16   Margarita Rana, MD  nebivolol (BYSTOLIC) 5 MG tablet Take 5 mg by mouth daily.    [provider]  niacin (NIASPAN) 500 MG CR tablet Take 1,000 mg by mouth at bedtime.    [provider]  Selenium 100 MCG TABS Take 1 tablet by mouth daily.    [provider]  vitamin B-12 (CYANOCOBALAMIN) 100 MCG tablet Take 50 mcg by mouth daily.    [provider]     Positive ROS: All  other systems have been reviewed and were otherwise negative with the exception of those mentioned in the HPI and as above.  Physical Exam: General: Alert, no acute distress Cardiovascular: Regular rate and rhythm, no murmurs rubs or gallops.  No pedal edema Respiratory: Clear to auscultation bilaterally, no wheezes rales or rhonchi. No cyanosis, no use of accessory musculature GI: No organomegaly, abdomen is soft and non-tender nondistended with positive bowel sounds. Skin: Skin intact, no lesions within the operative field. Neurologic: Sensation intact distally Psychiatric: Patient is competent for consent with normal mood and affect Lymphatic: No cervical lymphadenopathy  MUSCULOSKELETAL: Left wrist: Patient has a dorsal angular deformity of the wrist.  Her skin is intact.  Compartments are soft.  Patient has the ability to actively flex and extend all 5 digits of the left hand.  Her fingers are well-perfused and she has intact sensation light touch.  Patient could actively flex and extend her elbow as well as forward elevate and abduct her shoulder without pain.  Assessment: Closed distal both bone forearm fracture, left wrist  Plan: I discussed the fracture with the patient and her daughter in the emergency department.  We discussed treatment options including close reduction and casting under anesthesia versus open reduction internal fixation.  I am recommending an attempted closed reduction and casting.  I explained to the patient and her daughter that the possibility exists of further displacement of the fracture even in the cast which may necessitate later ORIF.  I discussed the risks and benefits of surgery. The risks include but are not limited to bleeding, nerve or blood vessel injury, joint stiffness or loss of motion, persistent pain, weakness or instability, malunion, nonunion, loss of fracture reduction and the need for further surgery. Medical risks include but are not limited  to DVT and pulmonary embolism, myocardial infarction, stroke, pneumonia, respiratory failure and death.  The patient and her daughter understood these risks and wished to proceed.   Patient has been n.p.o. since 10 PM last night.  Patient is not on any blood thinning medications.  Pre-op labs and an EKG have been ordered.    Thornton Park, MD   10/09/2019 6:46 AM

## 2019-10-09 NOTE — ED Notes (Signed)
Silver colored ring and silver colored watch removed from affected hand/wrist.  Given to visitor who brought her in

## 2019-10-09 NOTE — Anesthesia Preprocedure Evaluation (Addendum)
Anesthesia Evaluation  Patient identified by MRN, date of birth, ID band Patient awake    Reviewed: Allergy & Precautions, H&P , NPO status , reviewed documented beta blocker date and time   Airway Mallampati: II  TM Distance: >3 FB Neck ROM: full    Dental  (+) Missing Few natural teeth:   Pulmonary shortness of breath, pneumonia, resolved,           Cardiovascular hypertension,   2014 ECHO Study Conclusions   - Left ventricle: The cavity size was normal. There was mild  concentric hypertrophy. Systolic function was normal. The  estimated ejection fraction was in the range of 60% to  65%. Wall motion was normal; there were no regional wall  motion abnormalities. Doppler parameters are consistent  with abnormal left ventricular relaxation (grade 1  diastolic dysfunction).  - Aortic valve: Mild regurgitation.  - Mitral valve: Mild regurgitation.  - Left atrium: The atrium was mildly dilated.  - Right ventricle: Systolic function was normal.  - Pulmonary arteries: Systolic pressure was mildly elevated.  PA peak pressure: 26mm Hg (S).    Neuro/Psych    GI/Hepatic neg GERD  ,  Endo/Other    Renal/GU      Musculoskeletal  (+) Arthritis ,   Abdominal   Peds  Hematology   Anesthesia Other Findings Past Medical History: No date: Arthritis No date: Cancer ([place]) No date: Colon polyp No date: Cystitis No date: Diverticulosis of colon No date: Dyspnea No date: Hyperlipidemia No date: Hypertension No date: Memory loss No date: Menopausal disorder No date: Seasonal allergies No date: Tremor No date: Unsteady gait No date: Vitamin D deficiency  Past Surgical History: No date: ABDOMINAL HYSTERECTOMY  BMI    Body Mass Index: 21.61 kg/m      Reproductive/Obstetrics                           Anesthesia Physical Anesthesia Plan  ASA: II  Anesthesia Plan: General    Post-op Pain Management:    Induction: Intravenous  PONV Risk Score and Plan: Treatment may vary due to age or medical condition and TIVA  Airway Management Planned: Nasal Cannula, Natural Airway and Simple Face Mask  Additional Equipment:   Intra-op Plan:   Post-operative Plan:   Informed Consent: I have reviewed the patients History and Physical, chart, labs and discussed the procedure including the risks, benefits and alternatives for the proposed anesthesia with the patient or authorized representative who has indicated his/her understanding and acceptance.     Dental Advisory Given  Plan Discussed with: CRNA  Anesthesia Plan Comments:        Anesthesia Quick Evaluation

## 2019-10-09 NOTE — Anesthesia Post-op Follow-up Note (Signed)
Anesthesia QCDR form completed.        

## 2019-10-09 NOTE — ED Notes (Signed)
Volar splint applied to left arm per MD order

## 2019-10-09 NOTE — Op Note (Signed)
10/09/2019  9:28 AM  PATIENT:  Cynthia Patterson    PRE-OPERATIVE DIAGNOSIS: Closed, comminuted left distal both bone forearm fracture  POST-OPERATIVE DIAGNOSIS:  Same  PROCEDURE:  CLOSED REDUCTION AND CASTING OF LEFT DISTAL BOTH BONE FOREARM FRACTURE  SURGEON:  Thornton Park, MD  ANESTHESIA:   General  PREOPERATIVE INDICATIONS:  Cynthia Patterson is a  85 y.o. female with a diagnosis of closed, comminuted left distal radius fracture after a fall at home while putting on her pajamas.  Given the displacement of the fracture, the patient would benefit from a closed reduction and cast application.   I discussed the risks and benefits of the procedure with the patient and her daughter. The risks include bruising, swelling, compartment syndrome, failure of the reduction, malunion, nonunion and the need for further surgery including re-reduction versus ORIF. They understood these risks and wished to proceed.   OPERATIVE FINDINGS: Comminuted left distal both bone forearm fracture with significant dorsal angulation  OPERATIVE PROCEDURE: Patient was met in the preoperative area and had the left upper extremity marked with my initials and the word "yes" within the operative field according the hospital's correct site of surgery protocol. I answered all questions by the patient's daughter. Patient was brought to the operating room.  She underwent anesthesia with propofol and a mask. A timeout was performed to verify the patient's name, date of birth, medical record number, correct site of surgery and correct procedure to be performed.  Once all in attendance were in agreement case began.  Patient had initial FluoroScan images taken of the fracture. A closed reduction was performed applying a volarly directed force to the distal wrist. The fracture was brought into a neutral position on the sagittal views. Fracture reduction was confirmed on AP and lateral images.  Patient was then placed in finger traps with a  7 pound counterweight in the upper arm.  A short arm cast was then applied with a 3 point mold at the fracture site. The fracture reduction was confirmed on AP and lateral FluoroScan imaging following cast application. The fracture was determined to be in a near anatomic position.   The patient was then awoken and brought to the PACU in stable condition. I was present for the entire case. I spoke with the patient's letter in the postop consultation room to let her know the case had been performed without complication and the patient was doing well in the recovery room.

## 2019-10-09 NOTE — ED Triage Notes (Signed)
Pt to triage via w/c with no distress noted; pt reports tripped putting pajamas on injuring left wrist; pt denies any other c/o or injuries

## 2019-10-09 NOTE — ED Notes (Signed)
PT daughter has pt belongings.

## 2019-10-09 NOTE — Transfer of Care (Signed)
Immediate Anesthesia Transfer of Care Note  Patient: Cynthia Patterson  Procedure(s) Performed: CLOSED REDUCTION WRIST (Left Wrist) CAST APPLICATION (Left )  Patient Location: PACU  Anesthesia Type:General  Level of Consciousness: awake and patient cooperative  Airway & Oxygen Therapy: Patient Spontanous Breathing and Patient connected to nasal cannula oxygen  Post-op Assessment: Report given to RN and Post -op Vital signs reviewed and stable  Post vital signs: Reviewed and stable  Last Vitals:  Vitals Value Taken Time  BP 108/67 10/09/19 0914  Temp 36.2 C 10/09/19 0914  Pulse 65 10/09/19 0917  Resp 14 10/09/19 0917  SpO2 100 % 10/09/19 0917  Vitals shown include unvalidated device data.  Last Pain:  Vitals:   10/09/19 0914  TempSrc:   PainSc: 0-No pain         Complications: No apparent anesthesia complications

## 2019-10-09 NOTE — Discharge Instructions (Signed)
Eye Surgery Discharge Instructions    Expect mild scratchy sensation or mild soreness. DO NOT RUB YOUR EYE!  The day of surgery:  Minimal physical activity, but bed rest is not required  No reading, computer work, or close hand work  No bending, lifting, or straining.  May watch TV  For 24 hours:  No driving, legal decisions, or alcoholic beverages  Safety precautions  Eat anything you prefer: It is better to start with liquids, then soup then solid foods.  _____ Eye patch should be worn until postoperative exam tomorrow.  ____ Solar shield eyeglasses should be worn for comfort in the sunlight/patch while sleeping  Resume all regular medications including aspirin or Coumadin if these were discontinued prior to surgery. You may shower, bathe, shave, or wash your hair. Tylenol may be taken for mild discomfort.  Call your doctor if you experience significant pain, nausea, or vomiting, fever > 101 or other signs of infection. [phone] or [phone] Specific instructions:  Follow-up Information    Thornton Park, MD Follow up on 10/17/2019.   Specialty: Orthopedic Surgery Why: at 11:30am Contact information: [address] [place] [id number] [phone]

## 2019-10-09 NOTE — ED Provider Notes (Signed)
[facility] Emergency Department Provider Note  ____________________________________________   I have reviewed the triage vital signs and the nursing notes.   HISTORY  Chief Complaint Left wrist pain  History limited by: Not Limited   HPI Cynthia Patterson is a 85 y.o. female who presents to the emergency department today because of concern for left wrist pain after a fall. The patient fell while she was trying to get her pajamas on. She was standing up at the time. The patient fell onto her left side. Complaining primarily of pain in her left wrist and some pain in her left upper arm. The patient denies hitting her head.   Records reviewed. Per medical record review patient has a history of HTN, HLD.   Past Medical History:  Diagnosis Date  . Arthritis   . Cancer ([place])   . Colon polyp   . Cystitis   . Diverticulosis of colon   . Dyspnea   . Hyperlipidemia   . Hypertension   . Memory loss   . Menopausal disorder   . Seasonal allergies   . Tremor   . Unsteady gait   . Vitamin D deficiency     Patient Active Problem List   Diagnosis Date Noted  . Hypertension 04/26/2015  . 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 HLD (hyperlipidemia) 03/23/2009    Past Surgical History:  Procedure Laterality Date  . ABDOMINAL HYSTERECTOMY      Prior to Admission medications   Medication Sig Start Date End Date Taking? Authorizing Provider  Ascorbic Acid (VITAMIN C) 100 MG tablet Take 100 mg by mouth daily.    [provider]  aspirin 81 MG tablet Take 81 mg by mouth daily.    [provider]  BYSTOLIC 10 MG tablet TAKE 1 TABLET BY MOUTH ONCE A DAY 10/18/15   Margarita Rana, MD  CALCIUM-MAGNESIUM-ZINC PO Take 1 tablet by mouth daily.    [provider]  cholecalciferol (VITAMIN D) 1000 UNITS tablet Take 1,000 Units by mouth daily.    [provider]  losartan-hydrochlorothiazide (HYZAAR) 100-25 MG tablet TAKE 1  TABLET BY MOUTH EVERY DAY 09/22/16   Birdie Sons, MD  lovastatin (MEVACOR) 40 MG tablet Take 1 tablet (40 mg total) by mouth daily. Pt needs to schedule an office visit before anymore refills. 04/16/16   Margarita Rana, MD  nebivolol (BYSTOLIC) 10 MG tablet Take 1 tablet (10 mg total) by mouth daily. Pt needs to schedule an office visit before anymore refills. 03/21/16   Margarita Rana, MD  nebivolol (BYSTOLIC) 5 MG tablet Take 5 mg by mouth daily.    [provider]  niacin (NIASPAN) 500 MG CR tablet Take 1,000 mg by mouth at bedtime.    [provider]  Selenium 100 MCG TABS Take 1 tablet by mouth daily.    [provider]  vitamin B-12 (CYANOCOBALAMIN) 100 MCG tablet Take 50 mcg by mouth daily.    [provider]    Allergies Lisinopril and Norvasc [amlodipine besylate]  Family History  Problem Relation Age of Onset  . Heart failure Mother   . Lung cancer Father   . Cancer Paternal Grandmother   . Hypertension Sister     Social History Social History   Tobacco Use  . Smoking status: Never Smoker  . Smokeless tobacco: Never Used  Substance Use Topics  . Alcohol use: No  . Drug use: No    Review of Systems Constitutional: No fever/chills Eyes:  No visual changes. ENT: No sore throat. Cardiovascular: Denies chest pain. Respiratory: Denies shortness of breath. Gastrointestinal: No abdominal pain.  No nausea, no vomiting.  No diarrhea.   Genitourinary: Negative for dysuria. Musculoskeletal: Positive for left wrist and upper arm pain. Skin: Negative for rash. Neurological: Negative for headaches, focal weakness or numbness.  ____________________________________________   PHYSICAL EXAM:  VITAL SIGNS: ED Triage Vitals  Enc Vitals Group     BP 10/09/19 0042 (!) 135/91     Pulse Rate 10/09/19 0042 (!) 54     Resp 10/09/19 0042 18     Temp 10/09/19 0042 97.6 F (36.4 C)     Temp Source 10/09/19 0042 Oral     SpO2 10/09/19 0042 98  %     Weight 10/09/19 0041 122 lb (55.3 kg)     Height 10/09/19 0041 5\' 3"  (1.6 m)     Head Circumference --      Peak Flow --      Pain Score 10/09/19 0344 5   Constitutional: Alert and oriented.  Eyes: Conjunctivae are normal.  ENT      Head: Normocephalic and atraumatic.      Nose: No congestion/rhinnorhea.      Mouth/Throat: Mucous membranes are moist.      Neck: No stridor. Hematological/Lymphatic/Immunilogical: No cervical lymphadenopathy. Cardiovascular: Normal rate, regular rhythm.  No murmurs, rubs, or gallops. Respiratory: Normal respiratory effort without tachypnea nor retractions. Breath sounds are clear and equal bilaterally. No wheezes/rales/rhonchi. Gastrointestinal: Soft and non tender. No rebound. No guarding.  Genitourinary: Deferred Musculoskeletal: Obvious deformity to the left wrist. DP 2+ Sensation intact over  Neurologic:  Normal speech and language. No gross focal neurologic deficits are appreciated.  Skin:  Skin is warm, dry and intact. No rash noted. Psychiatric: Mood and affect are normal. Speech and behavior are normal. Patient exhibits appropriate insight and judgment.  ____________________________________________    LABS (pertinent positives/negatives)  None  ____________________________________________   EKG  None  ____________________________________________    RADIOLOGY  Left wrist Comminuted and displaced fracture of distal radius and ulna  Left humerus  No acute osseous abnormality  ____________________________________________   PROCEDURES  Procedures  ____________________________________________   INITIAL IMPRESSION / ASSESSMENT AND PLAN / ED COURSE  Pertinent labs & imaging results that were available during my care of the patient were reviewed by me and considered in my medical decision making (see chart for details).   Patient presented to the emergency department today because of concern for left wrist pain after a  fall. Also complaining of some upper arm pain. X-rays showed distal radial and ulna fracture. Discussed with Dr. Mack Guise with orthopedics. Will take patient to OR. Did perform hematoma block to try to help alleviate some of the discomfort.  ____________________________________________   FINAL CLINICAL IMPRESSION(S) / ED DIAGNOSES  Final diagnoses:  Closed fracture of distal end of left radius, unspecified fracture morphology, initial encounter  Fall, initial encounter     Note: This dictation was prepared with Diplomatic Services operational officer dictation. Any transcriptional errors that result from this process are unintentional     Nance Pear, MD 10/09/19 [phone]

## 2019-10-15 ENCOUNTER — Telehealth: Payer: Self-pay | Admitting: *Deleted

## 2019-10-15 NOTE — Telephone Encounter (Signed)
Patient's son Alvester Chou given negative covid results .

## 2020-01-09 ENCOUNTER — Other Ambulatory Visit: Payer: Self-pay | Admitting: Physician Assistant

## 2020-01-09 DIAGNOSIS — R634 Abnormal weight loss: Secondary | ICD-10-CM

## 2020-01-20 ENCOUNTER — Ambulatory Visit
Admission: RE | Admit: 2020-01-20 | Discharge: 2020-01-20 | Disposition: A | Discharge status: Home / Self Care | Payer: Medicare Other | Source: Ambulatory Visit | Attending: Physician Assistant | Admitting: Physician Assistant

## 2020-01-20 ENCOUNTER — Other Ambulatory Visit: Payer: Self-pay

## 2020-01-20 DIAGNOSIS — R634 Abnormal weight loss: Secondary | ICD-10-CM

## 2020-01-20 DIAGNOSIS — I700 Atherosclerosis of aorta: Secondary | ICD-10-CM | POA: Insufficient documentation

## 2020-01-20 DIAGNOSIS — I2510 Atherosclerotic heart disease of native coronary artery without angina pectoris: Secondary | ICD-10-CM | POA: Diagnosis not present

## 2020-01-20 DIAGNOSIS — R918 Other nonspecific abnormal finding of lung field: Secondary | ICD-10-CM | POA: Insufficient documentation

## 2020-01-20 DIAGNOSIS — I517 Cardiomegaly: Secondary | ICD-10-CM | POA: Insufficient documentation

## 2020-01-20 LAB — POCT I-STAT CREATININE: Creatinine, Ser: 1.1 mg/dL — ABNORMAL HIGH (ref 0.44–1.00)

## 2020-01-20 MED ORDER — IOHEXOL 300 MG/ML  SOLN
75.0000 mL | Freq: Once | INTRAMUSCULAR | Status: AC | PRN
  Administered 2020-01-20: 75 mL via INTRAVENOUS

## 2020-03-10 ENCOUNTER — Other Ambulatory Visit: Payer: Self-pay | Admitting: Specialist

## 2020-03-10 DIAGNOSIS — R600 Localized edema: Secondary | ICD-10-CM

## 2020-03-11 ENCOUNTER — Ambulatory Visit
Admission: RE | Admit: 2020-03-11 | Discharge: 2020-03-11 | Disposition: A | Discharge status: Home / Self Care | Payer: Medicare Other | Source: Ambulatory Visit | Attending: Specialist | Admitting: Specialist

## 2020-03-11 ENCOUNTER — Other Ambulatory Visit: Payer: Self-pay

## 2020-03-11 DIAGNOSIS — R600 Localized edema: Secondary | ICD-10-CM | POA: Diagnosis present

## 2020-09-16 ENCOUNTER — Other Ambulatory Visit: Payer: Self-pay | Admitting: Specialist

## 2020-09-16 DIAGNOSIS — R9389 Abnormal findings on diagnostic imaging of other specified body structures: Secondary | ICD-10-CM

## 2020-09-23 ENCOUNTER — Ambulatory Visit
Admission: RE | Admit: 2020-09-23 | Discharge: 2020-09-23 | Disposition: A | Discharge status: Home / Self Care | Payer: Medicare Other | Source: Ambulatory Visit | Attending: Specialist | Admitting: Specialist

## 2020-09-23 ENCOUNTER — Other Ambulatory Visit: Payer: Self-pay

## 2020-09-23 DIAGNOSIS — R9389 Abnormal findings on diagnostic imaging of other specified body structures: Secondary | ICD-10-CM | POA: Insufficient documentation

## 2020-12-09 ENCOUNTER — Inpatient Hospital Stay: Payer: Medicare Other

## 2020-12-09 ENCOUNTER — Inpatient Hospital Stay: Payer: Medicare Other | Attending: Oncology | Admitting: Oncology

## 2020-12-09 ENCOUNTER — Encounter (INDEPENDENT_AMBULATORY_CARE_PROVIDER_SITE_OTHER): Payer: Self-pay

## 2020-12-09 ENCOUNTER — Encounter: Payer: Self-pay | Admitting: Oncology

## 2020-12-09 VITALS — BP 145/72 | HR 54 | Temp 97.1°F | Resp 16 | Wt 106.7 lb

## 2020-12-09 DIAGNOSIS — Z8719 Personal history of other diseases of the digestive system: Secondary | ICD-10-CM | POA: Diagnosis not present

## 2020-12-09 DIAGNOSIS — D509 Iron deficiency anemia, unspecified: Secondary | ICD-10-CM | POA: Diagnosis not present

## 2020-12-09 DIAGNOSIS — Z801 Family history of malignant neoplasm of trachea, bronchus and lung: Secondary | ICD-10-CM | POA: Insufficient documentation

## 2020-12-09 DIAGNOSIS — Z8249 Family history of ischemic heart disease and other diseases of the circulatory system: Secondary | ICD-10-CM | POA: Insufficient documentation

## 2020-12-09 DIAGNOSIS — Z808 Family history of malignant neoplasm of other organs or systems: Secondary | ICD-10-CM | POA: Diagnosis not present

## 2020-12-09 DIAGNOSIS — Z888 Allergy status to other drugs, medicaments and biological substances status: Secondary | ICD-10-CM | POA: Insufficient documentation

## 2020-12-09 DIAGNOSIS — D472 Monoclonal gammopathy: Secondary | ICD-10-CM | POA: Diagnosis not present

## 2020-12-09 DIAGNOSIS — E785 Hyperlipidemia, unspecified: Secondary | ICD-10-CM | POA: Insufficient documentation

## 2020-12-09 DIAGNOSIS — Z79899 Other long term (current) drug therapy: Secondary | ICD-10-CM | POA: Diagnosis not present

## 2020-12-09 DIAGNOSIS — I10 Essential (primary) hypertension: Secondary | ICD-10-CM | POA: Insufficient documentation

## 2020-12-09 DIAGNOSIS — F0280 Dementia in other diseases classified elsewhere without behavioral disturbance: Secondary | ICD-10-CM | POA: Insufficient documentation

## 2020-12-09 NOTE — Progress Notes (Signed)
Hematology/Oncology Consult note [facility] [phone] [phone]   Patient Care Team: Marinda Elk, MD as PCP - General (Physician Assistant)  REFERRING PROVIDER: Marinda Elk, MD  CHIEF COMPLAINTS/REASON FOR VISIT:  Evaluation of anemia and abnormal SPEP.  HISTORY OF PRESENTING ILLNESS:   Cynthia Patterson is a  87 y.o.  female with PMH listed below was seen in consultation at the request of  Marinda Elk, MD  for evaluation of anemia and abnormal SPEP.  Patient has Alzheimer dementia.  She denies any complaints today.  Not able to provide much history.  She was accompanied by her daughter-in-law Stanton Kidney. Patient's daughter Lattie Haw is patient's power of attorney History was obtained from reviewing previous medical records as well as talking to daughter-in-law Stanton Kidney.  Per daughter, patient needs some assistance with ADL-dressing, bathing.  No reports of bloody or black stool.  Patient has been started on oral iron supplementation recently.  So far patient appears tolerating well.  Stool color was normal prior to the iron supplementation.  Turned darker after started iron.  Review of Systems  Unable to perform ROS: Dementia    MEDICAL HISTORY:  Past Medical History:  Diagnosis Date  . Alzheimer disease ([place])   . Arthritis   . Chronic constipation   . Colon polyp   . Cystitis   . Diverticulosis of colon   . Dyspnea   . Hyperlipidemia   . Hypertension   . Memory loss   . Menopausal disorder   . Parkinson disease ([place])   . Seasonal allergies   . Tremor   . Unsteady gait   . Vitamin D deficiency     SURGICAL HISTORY: Past Surgical History:  Procedure Laterality Date  . ABDOMINAL HYSTERECTOMY    . CAST APPLICATION Left 96/05/5915   Procedure: CAST APPLICATION;  Surgeon: Thornton Park, MD;  Location: [facility] ORS;  Service: Orthopedics;  Laterality: Left;  . CLOSED REDUCTION WRIST FRACTURE Left 10/09/2019    Procedure: CLOSED REDUCTION WRIST;  Surgeon: Thornton Park, MD;  Location: [facility] ORS;  Service: Orthopedics;  Laterality: Left;    SOCIAL HISTORY: Social History   Socioeconomic History  . Marital status: Widowed    Spouse name: Not on file  . Number of children: Not on file  . Years of education: Not on file  . Highest education level: Not on file  Occupational History  . Not on file  Tobacco Use  . Smoking status: Never Smoker  . Smokeless tobacco: Never Used  Substance and Sexual Activity  . Alcohol use: No  . Drug use: No  . Sexual activity: Not Currently  Other Topics Concern  . Not on file  Social History Narrative  . Not on file   Social Determinants of Health   Financial Resource Strain: Not on file  Food Insecurity: Not on file  Transportation Needs: Not on file  Physical Activity: Not on file  Stress: Not on file  Social Connections: Not on file  Intimate Partner Violence: Not on file    FAMILY HISTORY: Family History  Problem Relation Age of Onset  . Heart failure Mother   . Lung cancer Father   . Cancer Paternal Grandmother   . Hypertension Sister     ALLERGIES:  is allergic to lisinopril and norvasc [amlodipine besylate].  MEDICATIONS:  Current Outpatient Medications  Medication Sig Dispense Refill  . amLODipine (NORVASC) 2.5 MG tablet Take by mouth.    Marland Kitchen aspirin 81 MG tablet Take  81 mg by mouth daily.    . cholecalciferol (VITAMIN D) 1000 UNITS tablet Take 1,000 Units by mouth daily.    . ferrous sulfate 325 (65 FE) MG tablet Take by mouth.    . furosemide (LASIX) 20 MG tablet TAKE 0.5 TABLETS BY MOUTH ONCE DAILY.    Marland Kitchen losartan (COZAAR) 100 MG tablet Take by mouth.    . lovastatin (MEVACOR) 40 MG tablet Take 1 tablet by mouth daily.    . polyethylene glycol powder (GLYCOLAX/MIRALAX) 17 GM/SCOOP powder Take by mouth.    . propranolol ER (INDERAL LA) 80 MG 24 hr capsule Take 80 mg by mouth daily.    . rivastigmine (EXELON) 1.5 MG capsule Take  by mouth.    . vitamin B-12 (CYANOCOBALAMIN) 100 MCG tablet Take 50 mcg by mouth daily.    Marland Kitchen zinc gluconate 50 MG tablet Take by mouth.    . Ascorbic Acid (VITAMIN C) 100 MG tablet Take 100 mg by mouth daily. (Patient not taking: Reported on 12/09/2020)    . CALCIUM-MAGNESIUM-ZINC PO Take 1 tablet by mouth daily. (Patient not taking: Reported on 12/09/2020)    . HYDROcodone-acetaminophen (NORCO) 5-325 MG tablet Take 1 tablet by mouth every 4 (four) hours as needed for moderate pain. (Patient not taking: Reported on 12/09/2020) 30 tablet 0  . lovastatin (MEVACOR) 40 MG tablet Take 1 tablet (40 mg total) by mouth daily. Pt needs to schedule an office visit before anymore refills. (Patient not taking: Reported on 12/09/2020) 30 tablet 0  . niacin (NIASPAN) 500 MG CR tablet Take 1,000 mg by mouth at bedtime. (Patient not taking: Reported on 12/09/2020)    . ondansetron (ZOFRAN) 4 MG tablet Take 1 tablet (4 mg total) by mouth every 8 (eight) hours as needed for nausea or vomiting. (Patient not taking: Reported on 12/09/2020) 30 tablet 0  . Selenium 100 MCG TABS Take 1 tablet by mouth daily. (Patient not taking: Reported on 12/09/2020)     No current facility-administered medications for this visit.     PHYSICAL EXAMINATION: ECOG PERFORMANCE STATUS: 1 - Symptomatic but completely ambulatory Vitals:   12/09/20 1407  BP: (!) 145/72  Pulse: (!) 54  Resp: 16  Temp: (!) 97.1 F (36.2 C)   Filed Weights   12/09/20 1407  Weight: 106 lb 11.2 oz (48.4 kg)    Physical Exam Constitutional:      General: She is not in acute distress.    Comments: Patient walks independently  HENT:     Head: Normocephalic and atraumatic.  Eyes:     General: No scleral icterus. Cardiovascular:     Rate and Rhythm: Normal rate and regular rhythm.     Heart sounds: Normal heart sounds.  Pulmonary:     Effort: Pulmonary effort is normal. No respiratory distress.     Breath sounds: No wheezing.  Abdominal:     General: Bowel  sounds are normal. There is no distension.     Palpations: Abdomen is soft.  Musculoskeletal:        General: No deformity. Normal range of motion.     Cervical back: Normal range of motion and neck supple.  Skin:    General: Skin is warm and dry.     Findings: No erythema or rash.  Neurological:     Mental Status: She is alert. Mental status is at baseline.     Cranial Nerves: No cranial nerve deficit.     Comments: Orientated to her name, date of birth.  She recognizes some of her family members.  Not orientated to time, place.  Psychiatric:        Mood and Affect: Mood normal.     LABORATORY DATA:  I have reviewed the data as listed Lab Results  Component Value Date   WBC 10.2 10/09/2019   HGB 14.4 10/09/2019   HCT 43.5 10/09/2019   MCV 89.9 10/09/2019   PLT 241 10/09/2019   Recent Labs    01/20/20 0850  CREATININE 1.10*   Iron/TIBC/Ferritin/ %Sat No results found for: IRON, TIBC, FERRITIN, IRONPCTSAT    RADIOGRAPHIC STUDIES: I have personally reviewed the radiological images as listed and agreed with the findings in the report. No results found.    ASSESSMENT & PLAN:  1. Iron deficiency anemia, unspecified iron deficiency anemia type   2. Monoclonal gammopathy    #Iron deficiency anemia. Patient has had blood work done at [facility] Hemoglobin 9.7, MCV 79.1.  Iron panel showed decreased iron saturation 3, ferritin 8. She has been started on oral iron supplementation twice daily and so far tolerates. IV Venofer can be considered however it is associated with side effects.  Given her advanced age, baseline dementia, I will reserve IV Venofer treatment if she does not respond to oral iron supplementation. I recommend oral iron supplementation trial in the repeat blood work in 8 weeks.   #Monoclonal gammopathy.  11/29/2020 SPEP showed IgG monoclonal protein with lambda light chain specificity.  M protein is 1.1.  Patient's kidney function appears to be stable.   No bone complaints.  Calcium level is normal. I discussed with patient and family about the diagnosis of IgG MGUS which is an asymptomatic condition which has a small risk of progression to smoldering multiple myeloma and to symptomatic multiple myeloma. Less frequently, these patients progress to AL amyloidosis, light chain deposition disease, or another lymphoproliferative disorder. For now I recommend observation.  I will repeat protein after pheresis at the next visit.  I will check light chain ratio. Discussed the option of bone marrow biopsy in the future if disease progression. I called patient's daughter Lattie Haw [POA] and not able to reach her.  Patient's daughter-in-law Stanton Kidney will relay information to [facility].  Stanton Kidney agrees with the plan.  Orders Placed This Encounter  Procedures  . CBC with Differential/Platelet    Standing Status:   Future    Standing Expiration Date:   12/09/2021  . Comprehensive metabolic panel    Standing Status:   Future    Standing Expiration Date:   12/09/2021  . Ferritin    Standing Status:   Future    Standing Expiration Date:   12/09/2021  . Iron and TIBC    Standing Status:   Future    Standing Expiration Date:   12/09/2021  . Retic Panel    Standing Status:   Future    Standing Expiration Date:   12/09/2021  . Multiple Myeloma Panel (SPEP&IFE w/QIG)    Standing Status:   Future    Standing Expiration Date:   12/09/2021  . Kappa/lambda light chains    Standing Status:   Future    Standing Expiration Date:   12/09/2021  . Sample to Blood Bank    Standing Status:   Future    Standing Expiration Date:   12/09/2021    All questions were answered. The patient knows to call the clinic with any problems questions or concerns.   Marinda Elk, MD    Return of visit: 8  weeks Thank you for this kind referral and the opportunity to participate in the care of this patient. A copy of today's note is routed to referring provider    Earlie Server, MD, PhD Hematology  Oncology [facility] Pager- [phone] 12/09/2020

## 2020-12-09 NOTE — Progress Notes (Signed)
New patient evaluation today.  Patient is accompanied by her daughter in law , Babita Amaker.  The health care POA is patient's daughter, Cecile Sheerer.

## 2020-12-20 ENCOUNTER — Other Ambulatory Visit: Payer: Self-pay | Admitting: Gastroenterology

## 2020-12-20 DIAGNOSIS — R634 Abnormal weight loss: Secondary | ICD-10-CM

## 2020-12-20 DIAGNOSIS — R195 Other fecal abnormalities: Secondary | ICD-10-CM

## 2020-12-20 DIAGNOSIS — D500 Iron deficiency anemia secondary to blood loss (chronic): Secondary | ICD-10-CM

## 2020-12-23 ENCOUNTER — Emergency Department
Admission: EM | Admit: 2020-12-23 | Discharge: 2020-12-23 | Disposition: A | Discharge status: Home / Self Care | Payer: Medicare Other | Attending: Emergency Medicine | Admitting: Emergency Medicine

## 2020-12-23 ENCOUNTER — Other Ambulatory Visit: Payer: Self-pay

## 2020-12-23 ENCOUNTER — Emergency Department
Admission: RE | Admit: 2020-12-23 | Discharge: 2020-12-23 | Disposition: A | Discharge status: Home / Self Care | Payer: Medicare Other | Source: Ambulatory Visit | Attending: Gastroenterology | Admitting: Gastroenterology

## 2020-12-23 DIAGNOSIS — Z7982 Long term (current) use of aspirin: Secondary | ICD-10-CM | POA: Diagnosis not present

## 2020-12-23 DIAGNOSIS — D500 Iron deficiency anemia secondary to blood loss (chronic): Secondary | ICD-10-CM | POA: Insufficient documentation

## 2020-12-23 DIAGNOSIS — R195 Other fecal abnormalities: Secondary | ICD-10-CM

## 2020-12-23 DIAGNOSIS — Z79899 Other long term (current) drug therapy: Secondary | ICD-10-CM | POA: Insufficient documentation

## 2020-12-23 DIAGNOSIS — D509 Iron deficiency anemia, unspecified: Secondary | ICD-10-CM | POA: Diagnosis not present

## 2020-12-23 DIAGNOSIS — R160 Hepatomegaly, not elsewhere classified: Secondary | ICD-10-CM | POA: Diagnosis not present

## 2020-12-23 DIAGNOSIS — R634 Abnormal weight loss: Secondary | ICD-10-CM | POA: Diagnosis not present

## 2020-12-23 DIAGNOSIS — F0390 Unspecified dementia without behavioral disturbance: Secondary | ICD-10-CM | POA: Insufficient documentation

## 2020-12-23 DIAGNOSIS — I10 Essential (primary) hypertension: Secondary | ICD-10-CM | POA: Insufficient documentation

## 2020-12-23 DIAGNOSIS — G20 Parkinson's disease: Secondary | ICD-10-CM | POA: Diagnosis not present

## 2020-12-23 DIAGNOSIS — N6310 Unspecified lump in the right breast, unspecified quadrant: Secondary | ICD-10-CM | POA: Diagnosis not present

## 2020-12-23 DIAGNOSIS — R55 Syncope and collapse: Secondary | ICD-10-CM | POA: Insufficient documentation

## 2020-12-23 LAB — CBC
HCT: 33.9 % — ABNORMAL LOW (ref 36.0–46.0)
Hemoglobin: 10.1 g/dL — ABNORMAL LOW (ref 12.0–15.0)
MCH: 24.6 pg — ABNORMAL LOW (ref 26.0–34.0)
MCHC: 29.8 g/dL — ABNORMAL LOW (ref 30.0–36.0)
MCV: 82.5 fL (ref 80.0–100.0)
Platelets: 366 10*3/uL (ref 150–400)
RBC: 4.11 MIL/uL (ref 3.87–5.11)
RDW: 22.5 % — ABNORMAL HIGH (ref 11.5–15.5)
WBC: 13.5 10*3/uL — ABNORMAL HIGH (ref 4.0–10.5)
nRBC: 0 % (ref 0.0–0.2)

## 2020-12-23 LAB — TYPE AND SCREEN
ABO/RH(D): O POS
Antibody Screen: NEGATIVE

## 2020-12-23 LAB — BASIC METABOLIC PANEL
Anion gap: 7 (ref 5–15)
BUN: 39 mg/dL — ABNORMAL HIGH (ref 8–23)
CO2: 22 mmol/L (ref 22–32)
Calcium: 9.4 mg/dL (ref 8.9–10.3)
Chloride: 100 mmol/L (ref 98–111)
Creatinine, Ser: 0.97 mg/dL (ref 0.44–1.00)
GFR, Estimated: 56 mL/min — ABNORMAL LOW (ref >60)
Glucose, Bld: 109 mg/dL — ABNORMAL HIGH (ref 70–99)
Potassium: 4 mmol/L (ref 3.5–5.1)
Sodium: 129 mmol/L — ABNORMAL LOW (ref 135–145)

## 2020-12-23 LAB — POCT I-STAT CREATININE: Creatinine, Ser: 1.1 mg/dL — ABNORMAL HIGH (ref 0.44–1.00)

## 2020-12-23 LAB — URINALYSIS, COMPLETE (UACMP) WITH MICROSCOPIC
Bacteria, UA: NONE SEEN
Bilirubin Urine: NEGATIVE
Glucose, UA: NEGATIVE mg/dL
Hgb urine dipstick: NEGATIVE
Ketones, ur: NEGATIVE mg/dL
Leukocytes,Ua: NEGATIVE
Nitrite: NEGATIVE
Protein, ur: NEGATIVE mg/dL
Specific Gravity, Urine: 1.006 (ref 1.005–1.030)
Squamous Epithelial / HPF: NONE SEEN (ref 0–5)
pH: 5 (ref 5.0–8.0)

## 2020-12-23 LAB — TROPONIN I (HIGH SENSITIVITY): Troponin I (High Sensitivity): 13 ng/L (ref <18)

## 2020-12-23 MED ORDER — IOHEXOL 300 MG/ML  SOLN
75.0000 mL | Freq: Once | INTRAMUSCULAR | Status: AC | PRN
  Administered 2020-12-23: 75 mL via INTRAVENOUS

## 2020-12-23 MED ORDER — SODIUM CHLORIDE 0.9 % IV BOLUS
500.0000 mL | Freq: Once | INTRAVENOUS | Status: AC
  Administered 2020-12-23: 500 mL via INTRAVENOUS

## 2020-12-23 NOTE — Discharge Instructions (Addendum)
Return to the ER for new, worsening, or recurrent episodes of passing out, lightheadedness, confusion, weakness, vomiting or diarrhea, or any other new or worsening symptoms that concern you

## 2020-12-23 NOTE — ED Provider Notes (Signed)
[facility] Emergency Department Provider Note ____________________________________________   Event Date/Time   First MD Initiated Contact with Patient 12/23/20 1727     (approximate)  I have reviewed the triage vital signs and the nursing notes.   HISTORY  Chief Complaint Loss of Consciousness  Level 5 caveat: History present illness limited due to dementia  HPI Cynthia Patterson is a 87 y.o. female with PMH as noted below who presents with an episode of syncope.  The daughter states that the patient had gone for a CT today for outpatient work-up of anemia and GI bleeding.  She came home, had a bowel movement which was loose, and then had an episode of syncope.  She lost consciousness for a few minutes.  She then had another large and watery bowel movement.  There was no visible blood in the stool.  The patient was somewhat confused for a few minutes afterwards and has now returned to her baseline.  The patient herself has no acute complaints at this time.   Past Medical History:  Diagnosis Date  . Alzheimer disease ([place])   . Arthritis   . Chronic constipation   . Colon polyp   . Cystitis   . Diverticulosis of colon   . Dyspnea   . Hyperlipidemia   . Hypertension   . Memory loss   . Menopausal disorder   . Parkinson disease ([place])   . Seasonal allergies   . Tremor   . Unsteady gait   . Vitamin D deficiency     Patient Active Problem List   Diagnosis Date Noted  . Hypertension 04/26/2015  . Idiopathic interstitial pneumonia ([place]) 07/15/2013  . HLD (hyperlipidemia) 03/23/2009    Past Surgical History:  Procedure Laterality Date  . ABDOMINAL HYSTERECTOMY    . CAST APPLICATION Left 70/04/2375   Procedure: CAST APPLICATION;  Surgeon: Thornton Park, MD;  Location: [facility] ORS;  Service: Orthopedics;  Laterality: Left;  . CLOSED REDUCTION WRIST FRACTURE Left 10/09/2019   Procedure: CLOSED REDUCTION WRIST;  Surgeon: Thornton Park, MD;  Location:  [facility] ORS;  Service: Orthopedics;  Laterality: Left;    Prior to Admission medications   Medication Sig Start Date End Date Taking? Authorizing Provider  aspirin 81 MG tablet Take 81 mg by mouth daily.    [provider]  lovastatin (MEVACOR) 40 MG tablet Take 40 mg by mouth daily. 11/18/20   [provider]  polyethylene glycol powder (GLYCOLAX/MIRALAX) 17 GM/SCOOP powder Take 17 g by mouth daily.    [provider]  propranolol ER (INDERAL LA) 80 MG 24 hr capsule Take 80 mg by mouth daily. 11/18/20   [provider]  rivastigmine (EXELON) 1.5 MG capsule Take 1.5 mg by mouth daily. 06/15/20   [provider]  zinc gluconate 50 MG tablet Take 50 mg by mouth daily.    [provider]    Allergies Lisinopril and Norvasc [amlodipine besylate]  Family History  Problem Relation Age of Onset  . Heart failure Mother   . Lung cancer Father   . Cancer Paternal Grandmother   . Hypertension Sister     Social History Social History   Tobacco Use  . Smoking status: Never Smoker  . Smokeless tobacco: Never Used  Substance Use Topics  . Alcohol use: No  . Drug use: No    Review of Systems Level 5 caveat: Review of systems limited due to dementia  Cardiovascular: Denies chest pain. Gastrointestinal: No abdominal pain or vomiting.  Musculoskeletal: Negative for back pain. Skin: Negative for rash. Neurological: Negative for headache.   ____________________________________________   PHYSICAL EXAM:  VITAL SIGNS: ED Triage Vitals [12/23/20 1654]  Enc Vitals Group     BP 132/67     Pulse Rate (!) 47     Resp 14     Temp (!) 97.5 F (36.4 C)     Temp Source Oral     SpO2 100 %     Weight 106 lb (48.1 kg)     Height 5\' 3"  (1.6 m)     Head Circumference      Peak Flow      Pain Score      Pain Loc      Pain Edu?      Excl. in [place]?     Constitutional: Alert, oriented x2. Well appearing for age and in no acute distress. Eyes:  Conjunctivae are normal.  Head: Atraumatic. Nose: No congestion/rhinnorhea. Mouth/Throat: Mucous membranes are slightly dry.   Neck: Normal range of motion.  Cardiovascular: Normal rate, regular rhythm.  Good peripheral circulation. Respiratory: Normal respiratory effort.  No retractions.  Gastrointestinal: Soft and nontender. No distention.  Genitourinary: No flank tenderness. Musculoskeletal: No lower extremity edema.  Extremities warm and well perfused.  Neurologic:  Normal speech and language.  Motor intact in all extremities.  No facial droop.  No pronator drift.  Normal coordination.   Skin:  Skin is warm and dry. No rash noted. Psychiatric: Calm and cooperative.  ____________________________________________   LABS (all labs ordered are listed, but only abnormal results are displayed)  Labs Reviewed  BASIC METABOLIC PANEL - Abnormal; Notable for the following components:      Result Value   Sodium 129 (*)    Glucose, Bld 109 (*)    BUN 39 (*)    GFR, Estimated 56 (*)    All other components within normal limits  CBC - Abnormal; Notable for the following components:   WBC 13.5 (*)    Hemoglobin 10.1 (*)    HCT 33.9 (*)    MCH 24.6 (*)    MCHC 29.8 (*)    RDW 22.5 (*)    All other components within normal limits  URINALYSIS, COMPLETE (UACMP) WITH MICROSCOPIC - Abnormal; Notable for the following components:   Color, Urine STRAW (*)    APPearance CLEAR (*)    All other components within normal limits  CBG MONITORING, ED  TYPE AND SCREEN  TYPE AND SCREEN  TYPE AND SCREEN  TROPONIN I (HIGH SENSITIVITY)  TROPONIN I (HIGH SENSITIVITY)   ____________________________________________  EKG  ED ECG REPORT I, Arta Silence, the attending physician, personally viewed and interpreted this ECG.  Date: 12/23/2020 EKG Time: 1652 Rate: 49 Rhythm: Sinus bradycardia QRS Axis: Left axis Intervals: normal ST/T Wave abnormalities: normal Narrative Interpretation: no  evidence of acute ischemia  ____________________________________________  RADIOLOGY    ____________________________________________   PROCEDURES  Procedure(s) performed: No  Procedures  Critical Care performed: No ____________________________________________   INITIAL IMPRESSION / ASSESSMENT AND PLAN / ED COURSE  Pertinent labs & imaging results that were available during my care of the patient were reviewed by me and considered in my medical decision making (see chart for details).  87 year old female with PMH as noted above presents with an episode of syncope shortly after having a bowel movement.  She then had another loose bowel movement afterwards.  She was confused for a short time but is now back to her baseline per  the daughter who is the main historian.  The patient herself has no acute complaints.  She had an outpatient CT earlier today.  I reviewed the past medical records in [place].  The patient was recently evaluated by hematology for anemia.  She was recently started on iron supplementation.  She has a history of monoclonal gammopathy.  Per the daughter, she was ordered for a CT chest/abdomen/pelvis today for work-up of the anemia and possible GI bleeding.  On exam, the patient is overall well-appearing for her age.  Her vital signs are normal except for heart rate in the 50s, which the daughter states is her baseline.  Physical exam is otherwise unremarkable.  Neurologic exam is nonfocal.  Differential includes vasovagal syncope or other benign etiology, dehydration, electrolyte abnormality or other metabolic disturbance, hypovolemia related to diarrhea, worsening anemia, or possible acute infection.  I have a low suspicion for primary cardiac etiology.  Initial EKG showed sinus bradycardia with rate is only slightly below the patient's baseline in the mid 50s.  The patient has no focal neurologic findings and there is no evidence of CVA, TIA, or other acute CNS  cause.  We will obtain labs, give a fluid bolus, and reassess.  ----------------------------------------- 8:34 PM on 12/23/2020 -----------------------------------------  Lab work-up is unremarkable.  Troponin and urinalysis are both within normal limits.  The patient continues to appear comfortable and her vital signs are stable after receiving fluids.  At this time, I do not think that there is any specific benefit in admitting the patient for observation overnight.  I discussed this with the patient and her daughter.  The daughter agrees and feels comfortable taking the patient home.  I recommend close PMD follow-up.  Return precautions given, and the daughter expresses understanding ____________________________________________   FINAL CLINICAL IMPRESSION(S) / ED DIAGNOSES  Final diagnoses:  Syncope, unspecified syncope type      NEW MEDICATIONS STARTED DURING THIS VISIT:  New Prescriptions   No medications on file     Note:  This document was prepared using Dragon voice recognition software and may include unintentional dictation errors.    Arta Silence, MD 12/23/20 2037

## 2020-12-23 NOTE — ED Triage Notes (Signed)
Pt had a CT scan today and once home passed out in the bathroom per daughter in law. Pt has history of GI bleed. Pt's daughter in law witnessed fall, states pt did not hit head. Pt very weak afterwards. Pt with hx of dementia. Daughter in law present for triage.

## 2020-12-27 ENCOUNTER — Telehealth: Payer: Self-pay | Admitting: *Deleted

## 2020-12-27 NOTE — Telephone Encounter (Signed)
Done..  MD only for discussion this week. I called and made pts daughter Lattie Haw aware of the sched 12/29/21 @ 11:15

## 2020-12-29 ENCOUNTER — Encounter: Payer: Self-pay | Admitting: Oncology

## 2020-12-29 ENCOUNTER — Telehealth: Payer: Self-pay

## 2020-12-29 ENCOUNTER — Other Ambulatory Visit: Payer: Self-pay

## 2020-12-29 ENCOUNTER — Inpatient Hospital Stay: Payer: Medicare Other | Admitting: Oncology

## 2020-12-29 VITALS — BP 149/62 | HR 50 | Temp 96.0°F

## 2020-12-29 DIAGNOSIS — K6389 Other specified diseases of intestine: Secondary | ICD-10-CM

## 2020-12-29 DIAGNOSIS — Z7189 Other specified counseling: Secondary | ICD-10-CM

## 2020-12-29 DIAGNOSIS — D509 Iron deficiency anemia, unspecified: Secondary | ICD-10-CM | POA: Diagnosis not present

## 2020-12-29 DIAGNOSIS — R160 Hepatomegaly, not elsewhere classified: Secondary | ICD-10-CM | POA: Diagnosis not present

## 2020-12-29 DIAGNOSIS — D472 Monoclonal gammopathy: Secondary | ICD-10-CM | POA: Insufficient documentation

## 2020-12-29 MED ORDER — SENNA 8.6 MG PO TABS: 2.0000 | ORAL_TABLET | Freq: Every day | ORAL | 0 refills | Status: AC

## 2020-12-29 NOTE — Telephone Encounter (Signed)
Order for CT liver biopsy faxed to specialty scheduling. Pt will see MD 1 week after biopsy.

## 2020-12-29 NOTE — Progress Notes (Signed)
Pt here for follow up. No new concerns voiced.   

## 2020-12-29 NOTE — Progress Notes (Signed)
Hematology/Oncology Consult note [facility] [phone] [phone]   Patient Care Team: Marinda Elk, MD as PCP - General (Physician Assistant)  REFERRING PROVIDER: Marinda Elk, MD  CHIEF COMPLAINTS/REASON FOR VISIT:  Abnormal CT scan, anemia and MGUS.  HISTORY OF PRESENTING ILLNESS:   KRYSTELLE PRASHAD is a  87 y.o.  female with PMH listed below was seen in consultation at the request of  Marinda Elk, MD  for evaluation of anemia and abnormal SPEP.  Patient has Alzheimer dementia.  She denies any complaints today.  Not able to provide much history.  She was accompanied by her daughter-in-law Stanton Kidney. Patient's daughter Lattie Haw is patient's power of attorney History was obtained from reviewing previous medical records as well as talking to daughter-in-law Stanton Kidney.  Per daughter, patient needs some assistance with ADL-dressing, bathing.  No reports of bloody or black stool.  Patient has been started on oral iron supplementation recently.  So far patient appears tolerating well.  Stool color was normal prior to the iron supplementation.  Turned darker after started iron.  #INTERVAL HISTORY JANUARY BERGTHOLD is a 87 y.o. female who has above history reviewed by me today presents for follow up visit for management of abnormal CT scan, iron deficient anemia and MGUS. Problems and complaints are listed below: Unintentional weight loss and heme positive stool, patient was seen by gastroenterology.  12/23/2020, CT chest abdomen pelvis showed wall thickening of the cecum/terminal ileum suspicious for malignant given blood in the stool.  At least 6 new hepatic masses.  Highly suspicious for metastatic disease. 1.7 in the right breast mass indeterminate.  Suspicious for malignancy.  Decreasing right upper lobe airspace disease/consolidation with unchanged lingular and lower lobe atelectasis and bronchiectasis.  Cholelithiasis without CT evidence of  acute cholecystitis.  Aortic atherosclerosis. Patient was accompanied by sister in law, daughter and son to further discuss CT results and further management plan. Patient cannot provide any history due to Alzheimer's dementia  12/23/2020, patient presented to emergency room for evaluation of syncope.  Per patient's family, patient had a large bowel movement after having to take oral contrast after CT.  Sister reports that patient has been very constipated.  Last bowel movement was on 12/23/2020.  Review of Systems  Unable to perform ROS: Dementia    MEDICAL HISTORY:  Past Medical History:  Diagnosis Date  . Alzheimer disease ([place])   . Arthritis   . Chronic constipation   . Colon polyp   . Cystitis   . Diverticulosis of colon   . Dyspnea   . Hyperlipidemia   . Hypertension   . Memory loss   . Menopausal disorder   . Parkinson disease ([place])   . Seasonal allergies   . Tremor   . Unsteady gait   . Vitamin D deficiency     SURGICAL HISTORY: Past Surgical History:  Procedure Laterality Date  . ABDOMINAL HYSTERECTOMY    . CAST APPLICATION Left 66/12/9474   Procedure: CAST APPLICATION;  Surgeon: Thornton Park, MD;  Location: [facility] ORS;  Service: Orthopedics;  Laterality: Left;  . CLOSED REDUCTION WRIST FRACTURE Left 10/09/2019   Procedure: CLOSED REDUCTION WRIST;  Surgeon: Thornton Park, MD;  Location: [facility] ORS;  Service: Orthopedics;  Laterality: Left;    SOCIAL HISTORY: Social History   Socioeconomic History  . Marital status: Widowed    Spouse name: Not on file  . Number of children: Not on file  . Years of education: Not on file  .  Highest education level: Not on file  Occupational History  . Not on file  Tobacco Use  . Smoking status: Never Smoker  . Smokeless tobacco: Never Used  Substance and Sexual Activity  . Alcohol use: No  . Drug use: No  . Sexual activity: Not Currently  Other Topics Concern  . Not on file  Social History Narrative  . Not on file    Social Determinants of Health   Financial Resource Strain: Not on file  Food Insecurity: Not on file  Transportation Needs: Not on file  Physical Activity: Not on file  Stress: Not on file  Social Connections: Not on file  Intimate Partner Violence: Not on file    FAMILY HISTORY: Family History  Problem Relation Age of Onset  . Heart failure Mother   . Lung cancer Father   . Cancer Paternal Grandmother   . Hypertension Sister     ALLERGIES:  is allergic to lisinopril and norvasc [amlodipine besylate].  MEDICATIONS:  Current Outpatient Medications  Medication Sig Dispense Refill  . amLODipine (NORVASC) 2.5 MG tablet Take 2.5 mg by mouth daily.    Marland Kitchen aspirin 81 MG tablet Take 81 mg by mouth daily.    . B Complex-C (SUPER B COMPLEX PO) Take by mouth.    Marland Kitchen CALCIUM-MAGNESIUM-ZINC PO Take by mouth.    . Cholecalciferol (VITAMIN D3 SUPER STRENGTH) 50 MCG (2000 UT) TABS Take by mouth.    . Cyanocobalamin (VITAMIN B12) 1000 MCG TBCR Take by mouth.    . ferrous sulfate 325 (65 FE) MG tablet Take 325 mg by mouth daily with breakfast.    . furosemide (LASIX) 20 MG tablet Take 20 mg by mouth.    . losartan (COZAAR) 100 MG tablet Take 100 mg by mouth daily.    Marland Kitchen lovastatin (MEVACOR) 40 MG tablet Take 40 mg by mouth daily.    . Multiple Vitamin (MULTIVITAMIN) tablet Take 1 tablet by mouth daily.    . propranolol ER (INDERAL LA) 80 MG 24 hr capsule Take 80 mg by mouth daily.    . rivastigmine (EXELON) 1.5 MG capsule Take 1.5 mg by mouth daily.    Marland Kitchen senna (SENOKOT) 8.6 MG TABS tablet Take 2 tablets (17.2 mg total) by mouth daily. 120 tablet 0  . zinc gluconate 50 MG tablet Take 50 mg by mouth daily.    . polyethylene glycol powder (GLYCOLAX/MIRALAX) 17 GM/SCOOP powder Take 17 g by mouth daily. (Patient not taking: Reported on 12/29/2020)     No current facility-administered medications for this visit.     PHYSICAL EXAMINATION: ECOG PERFORMANCE STATUS: 1 - Symptomatic but completely  ambulatory Vitals:   12/29/20 1120  BP: (!) 149/62  Pulse: (!) 50  Temp: (!) 96 F (35.6 C)   Filed Weights    Physical Exam Constitutional:      General: She is not in acute distress.    Comments: Patient walks independently  HENT:     Head: Normocephalic and atraumatic.  Eyes:     General: No scleral icterus. Cardiovascular:     Rate and Rhythm: Normal rate and regular rhythm.     Heart sounds: Normal heart sounds.  Pulmonary:     Effort: Pulmonary effort is normal. No respiratory distress.     Breath sounds: No wheezing.  Abdominal:     General: Bowel sounds are normal. There is no distension.     Palpations: Abdomen is soft.  Musculoskeletal:        General:  No deformity. Normal range of motion.     Cervical back: Normal range of motion and neck supple.  Skin:    General: Skin is warm and dry.     Findings: No erythema or rash.  Neurological:     Mental Status: She is alert. Mental status is at baseline.     Cranial Nerves: No cranial nerve deficit.     Comments: Orientated to her name, date of birth. .  Not orientated to time, place.  Psychiatric:        Mood and Affect: Mood normal.     LABORATORY DATA:  I have reviewed the data as listed Lab Results  Component Value Date   WBC 13.5 (H) 12/23/2020   HGB 10.1 (L) 12/23/2020   HCT 33.9 (L) 12/23/2020   MCV 82.5 12/23/2020   PLT 366 12/23/2020   Recent Labs    01/20/20 0850 12/23/20 1307 12/23/20 1707  NA  --   --  129*  K  --   --  4.0  CL  --   --  100  CO2  --   --  22  GLUCOSE  --   --  109*  BUN  --   --  39*  CREATININE 1.10* 1.10* 0.97  CALCIUM  --   --  9.4  GFRNONAA  --   --  56*   Iron/TIBC/Ferritin/ %Sat No results found for: IRON, TIBC, FERRITIN, IRONPCTSAT    RADIOGRAPHIC STUDIES: I have personally reviewed the radiological images as listed and agreed with the findings in the report. CT CHEST ABDOMEN PELVIS W CONTRAST  Result Date: 12/25/2020 CLINICAL DATA:  87 year old  female with blood in stools in unintentional weight loss. Anemia. EXAM: CT CHEST, ABDOMEN, AND PELVIS WITH CONTRAST TECHNIQUE: Multidetector CT imaging of the chest, abdomen and pelvis was performed following the standard protocol during bolus administration of intravenous contrast. CONTRAST:  32mL OMNIPAQUE IOHEXOL 300 MG/ML  SOLN COMPARISON:  09/23/2020 chest CT and 01/20/2020 chest, abdomen and pelvic CT. FINDINGS: CT CHEST FINDINGS Cardiovascular: Cardiomegaly is again identified. Aortic atherosclerotic calcifications noted without thoracic aortic aneurysm. No pericardial effusion. Mediastinum/Nodes: No mediastinal mass or enlarged lymph nodes. The visualized trachea, thyroid gland and esophagus are unremarkable. Lungs/Pleura: Posterior RIGHT UPPER lobe airspace disease/consolidation has decreased from 09/23/2020. Moderate lingular atelectasis with cylindrical bronchiectasis is unchanged. Streaky/ground-glass opacities within both LOWER lobes are relatively unchanged. No new or enlarging pulmonary opacities are identified. Musculoskeletal: A 1.7 cm INNER RIGHT breast mass is identified. No acute or suspicious bony abnormalities are noted. CT ABDOMEN PELVIS FINDINGS Hepatobiliary: At least 6 new hepatic masses are identified since 01/20/2020 highly suspicious for metastatic disease. An index mass within the low RIGHT liver measures 2 x 2.5 cm (series 2: Image 61). There is no evidence of biliary dilatation. Cholelithiasis without CT evidence of gallbladder wall thickening or pericholecystic fluid identified. Pancreas: Unremarkable Spleen: Unremarkable Adrenals/Urinary Tract: Bilateral renal atrophy and fullness of both renal collecting systems noted. Bilateral renal cysts are again identified. The adrenal glands and bladder are unremarkable. Stomach/Bowel: There is apparent wall thickening of the cecum/terminal ileum, suspicious for malignancy given blood in stools and metastases within the liver. A few prominent  pericecal lymph nodes are noted. There is no evidence of bowel obstruction, pneumoperitoneum or abscess. A moderate amount of colonic stool is noted with a large amount of rectal stool identified. Colonic diverticulosis is identified without evidence of acute diverticulitis. Vascular/Lymphatic: Aortic atherosclerosis. No enlarged abdominal or pelvic lymph nodes. Reproductive:  Status post hysterectomy. No adnexal masses. Other: No ascites Musculoskeletal: No acute or suspicious bony abnormalities are identified. IMPRESSION: 1. Apparent wall thickening of the cecum/terminal ileum, suspicious for malignancy given blood in stools. 2. At least 6 new hepatic masses - highly suspicious for metastatic disease. 3. 1.7 cm INNER RIGHT breast mass indeterminate but suspicious for malignancy. Correlation with diagnostic mammography and ultrasound is recommended as clinically indicated. No abnormal appearing RIGHT axillary lymph nodes. 4. Decreasing RIGHT UPPER lobe airspace disease/consolidation, with unchanged lingular and LOWER lobe atelectasis and cylindrical bronchiectasis. 5. Cholelithiasis without CT evidence of acute cholecystitis. 6. Aortic Atherosclerosis (ICD10-I70.0). Electronically Signed   By: Margarette Canada M.D.   On: 12/25/2020 13:15      ASSESSMENT & PLAN:  1. Liver mass   2. Iron deficiency anemia, unspecified iron deficiency anemia type   3. Monoclonal gammopathy   4. Mass of colon   5. Goals of care, counseling/discussion    #Abnormal CT scan with wall thickening of cecum/ileum, new hepatic masses.  Images were independently reviewed by me and discussed  Discussed with patient and her family members that image findings are very suspicious for primary colon cancer with liver mets.  Tissue diagnosis will be required to establish cancer diagnosis. Liver mass biopsy may be considered for further evaluation.  Colonoscopy may be beneficial for direct visualization of extent of obstruction which may  further guide recommendation for palliative surgery if patient is at risk of malignant obstruction. Sister in law Stanton Kidney is very concerned about patient's advanced age, ability to tolerate colonoscopy prep. Family member agree with liver mass biopsy for further evaluation. They understand that likely the patient has stage IV cancer with poor prognosis.  Patient is not a candidate for aggressive chemotherapy treatments.  #Iron deficiency anemia. Per patient's sister, patient is constipated and has not had a bowel movement since last week.  She is concerned that iron may have caused patient to be more constipated.  Discussed about IV iron infusion option. Plan IV iron with Venofer 200mg  weekly x 2 doses. Allergy reactions/infusion reactions including anaphylactic reaction were discussed.. Other side effects include but not limited to high blood pressure, skin rash, weight gain, leg swelling, etc. patient's family members agree with the plan and are willing to proceed with treatments.  #Breast mass, suspicious.  Will need a diagnostic mammogram and ultrasound for further evaluation.  Currently the: Thickening with liver mass work-up takes priority.  goals of care, palliative intent.  I recommend patient to establish care with palliative care service.Marland Kitchen   #Monoclonal gammopathy.  11/29/2020 SPEP showed IgG monoclonal protein with lambda light chain specificity.  M protein is 1.1.  Patient's kidney function appears to be stable.  No bone complaints.  Calcium level is normal.  Continue observation.    Orders Placed This Encounter  Procedures  . CT Biopsy    Standing Status:   Future    Standing Expiration Date:   12/29/2021    Order Specific Question:   Lab orders requested (DO NOT place separate lab orders, these will be automatically ordered during procedure specimen collection):    Answer:   Surgical Pathology    Order Specific Question:   Reason for Exam (SYMPTOM  OR DIAGNOSIS REQUIRED)    Answer:    CT biopsy of liver. Reason: colon mass , liver mass    Order Specific Question:   Preferred location?    Answer:   [facility]  . Ambulatory Referral to Palliative Care  Referral Priority:   Routine    Referral Type:   Consultation    Referral Reason:   Advance Care Planning    Number of Visits Requested:   1    All questions were answered. The patient knows to call the clinic with any problems questions or concerns.   Marinda Elk, MD    Return of visit: 1 week after liver biopsy.  Earlie Server, MD, PhD Hematology Oncology [facility] Pager- [phone] 12/29/2020

## 2020-12-30 ENCOUNTER — Other Ambulatory Visit: Payer: Self-pay

## 2020-12-30 DIAGNOSIS — R160 Hepatomegaly, not elsewhere classified: Secondary | ICD-10-CM

## 2020-12-30 DIAGNOSIS — K6389 Other specified diseases of intestine: Secondary | ICD-10-CM

## 2020-12-30 NOTE — Telephone Encounter (Signed)
Done..  Pt is sched to RTC on 01/14/21 @ 130 She was already sched to RTC on 01/14/21 for Venofer @ 2pm So, both appts will be on the same day to keep her from making 2 trips.

## 2020-12-30 NOTE — Telephone Encounter (Signed)
Biopsy scheduled for Thur 3/3 at 10a and to arrive at [place]. The IR nurse will call a few days before to go over her instructions. Please schedule pt for MD follow up approx 1 week after biopsy. I will call pt to let her know about appts.

## 2020-12-30 NOTE — Telephone Encounter (Signed)
Spoke to pt's daughter, Stanton Kidney, and gave her all appt details.

## 2021-01-04 ENCOUNTER — Other Ambulatory Visit: Payer: Self-pay | Admitting: Oncology

## 2021-01-04 DIAGNOSIS — D500 Iron deficiency anemia secondary to blood loss (chronic): Secondary | ICD-10-CM

## 2021-01-04 DIAGNOSIS — D509 Iron deficiency anemia, unspecified: Secondary | ICD-10-CM | POA: Insufficient documentation

## 2021-01-04 NOTE — Progress Notes (Signed)
Called and left messages for daughter Lattie Haw, and daughter-in-law, Stanton Kidney, and Stanton Kidney called back.  Stanton Kidney states patient will not be able to answer any questions as she has Alzheimer's/dementia.  Stanton Kidney will accompany patient for procedure.  Stanton Kidney also states, daughter Lattie Haw, will be present to sign the consent for the procedure as she is power of attorney. Discussed planned procedure for Thursday 01/06/2021, and to arrive at  0900 and [facility] verbalized understandig.  Discussed NPO after midnight day of procedure and Stanton Kidney verbalized understanding. Stanton Kidney states to use the 'home' phone number [phone], for all calls and patient lives with son and daughter-in-law.

## 2021-01-05 ENCOUNTER — Other Ambulatory Visit: Payer: Self-pay | Admitting: Radiology

## 2021-01-06 ENCOUNTER — Ambulatory Visit
Admission: RE | Admit: 2021-01-06 | Discharge: 2021-01-06 | Disposition: A | Discharge status: Home / Self Care | Payer: Medicare Other | Source: Ambulatory Visit | Attending: Oncology | Admitting: Oncology

## 2021-01-06 ENCOUNTER — Other Ambulatory Visit: Payer: Self-pay

## 2021-01-06 DIAGNOSIS — R160 Hepatomegaly, not elsewhere classified: Secondary | ICD-10-CM | POA: Insufficient documentation

## 2021-01-06 DIAGNOSIS — K6389 Other specified diseases of intestine: Secondary | ICD-10-CM | POA: Insufficient documentation

## 2021-01-06 LAB — CBC
HCT: 30.7 % — ABNORMAL LOW (ref 36.0–46.0)
Hemoglobin: 9.1 g/dL — ABNORMAL LOW (ref 12.0–15.0)
MCH: 24.9 pg — ABNORMAL LOW (ref 26.0–34.0)
MCHC: 29.6 g/dL — ABNORMAL LOW (ref 30.0–36.0)
MCV: 83.9 fL (ref 80.0–100.0)
Platelets: 335 10*3/uL (ref 150–400)
RBC: 3.66 MIL/uL — ABNORMAL LOW (ref 3.87–5.11)
RDW: 21.3 % — ABNORMAL HIGH (ref 11.5–15.5)
WBC: 9.7 10*3/uL (ref 4.0–10.5)
nRBC: 0 % (ref 0.0–0.2)

## 2021-01-06 LAB — PROTIME-INR
INR: 1 (ref 0.8–1.2)
Prothrombin Time: 12.7 seconds (ref 11.4–15.2)

## 2021-01-06 MED ORDER — SODIUM CHLORIDE 0.9 % IV SOLN: INTRAVENOUS | Status: DC

## 2021-01-06 MED ORDER — MIDAZOLAM HCL 2 MG/2ML IJ SOLN
INTRAMUSCULAR | Status: AC
  Filled 2021-01-06: qty 2

## 2021-01-06 MED ORDER — FENTANYL CITRATE (PF) 100 MCG/2ML IJ SOLN
INTRAMUSCULAR | Status: AC
  Filled 2021-01-06: qty 2

## 2021-01-06 NOTE — OR Nursing (Signed)
PT's POA opted against procedure after speaking with MD. PT dressed back into her clothes and wheeled out.

## 2021-01-07 ENCOUNTER — Inpatient Hospital Stay: Payer: Medicare Other | Attending: Oncology

## 2021-01-07 ENCOUNTER — Inpatient Hospital Stay (HOSPITAL_BASED_OUTPATIENT_CLINIC_OR_DEPARTMENT_OTHER): Payer: Medicare Other | Admitting: Hospice and Palliative Medicine

## 2021-01-07 VITALS — BP 159/61 | HR 59 | Temp 99.2°F | Resp 18

## 2021-01-07 DIAGNOSIS — K8020 Calculus of gallbladder without cholecystitis without obstruction: Secondary | ICD-10-CM | POA: Diagnosis not present

## 2021-01-07 DIAGNOSIS — K6389 Other specified diseases of intestine: Secondary | ICD-10-CM | POA: Diagnosis not present

## 2021-01-07 DIAGNOSIS — K639 Disease of intestine, unspecified: Secondary | ICD-10-CM | POA: Insufficient documentation

## 2021-01-07 DIAGNOSIS — G309 Alzheimer's disease, unspecified: Secondary | ICD-10-CM | POA: Insufficient documentation

## 2021-01-07 DIAGNOSIS — G20 Parkinson's disease: Secondary | ICD-10-CM | POA: Insufficient documentation

## 2021-01-07 DIAGNOSIS — F0280 Dementia in other diseases classified elsewhere without behavioral disturbance: Secondary | ICD-10-CM | POA: Diagnosis not present

## 2021-01-07 DIAGNOSIS — D472 Monoclonal gammopathy: Secondary | ICD-10-CM | POA: Insufficient documentation

## 2021-01-07 DIAGNOSIS — I10 Essential (primary) hypertension: Secondary | ICD-10-CM | POA: Insufficient documentation

## 2021-01-07 DIAGNOSIS — D500 Iron deficiency anemia secondary to blood loss (chronic): Secondary | ICD-10-CM | POA: Diagnosis not present

## 2021-01-07 DIAGNOSIS — R160 Hepatomegaly, not elsewhere classified: Secondary | ICD-10-CM | POA: Diagnosis not present

## 2021-01-07 DIAGNOSIS — E785 Hyperlipidemia, unspecified: Secondary | ICD-10-CM | POA: Diagnosis not present

## 2021-01-07 DIAGNOSIS — I700 Atherosclerosis of aorta: Secondary | ICD-10-CM | POA: Diagnosis not present

## 2021-01-07 DIAGNOSIS — Z515 Encounter for palliative care: Secondary | ICD-10-CM | POA: Diagnosis not present

## 2021-01-07 DIAGNOSIS — Z79899 Other long term (current) drug therapy: Secondary | ICD-10-CM | POA: Diagnosis not present

## 2021-01-07 DIAGNOSIS — N63 Unspecified lump in unspecified breast: Secondary | ICD-10-CM | POA: Diagnosis not present

## 2021-01-07 DIAGNOSIS — G893 Neoplasm related pain (acute) (chronic): Secondary | ICD-10-CM

## 2021-01-07 DIAGNOSIS — D509 Iron deficiency anemia, unspecified: Secondary | ICD-10-CM | POA: Diagnosis not present

## 2021-01-07 MED ORDER — SODIUM CHLORIDE 0.9 % IV SOLN
Freq: Once | INTRAVENOUS | Status: AC
  Filled 2021-01-07: qty 250

## 2021-01-07 MED ORDER — IRON SUCROSE 20 MG/ML IV SOLN
200.0000 mg | Freq: Once | INTRAVENOUS | Status: AC
  Administered 2021-01-07: 200 mg via INTRAVENOUS
  Filled 2021-01-07: qty 10

## 2021-01-07 MED ORDER — SODIUM CHLORIDE 0.9 % IV SOLN: 200.0000 mg | Freq: Once | INTRAVENOUS | Status: DC

## 2021-01-07 NOTE — Progress Notes (Signed)
Patient received prescribed treatment in clinic. Tolerated well. Patient stable at discharge. 

## 2021-01-07 NOTE — Progress Notes (Signed)
[place]  [phone] [phone]   Name: Cynthia Patterson Date: 01/07/2021 MRN: [id number]  DOB: [birth year]  Patient Care Team: Marinda Elk, MD as PCP - General (Physician Assistant) Earlie Server, MD as Consulting Physician (Hematology and Oncology)    REASON FOR CONSULTATION: Cynthia Patterson is a 87 y.o. female with multiple medical problems including Parkinson's, hypertension, hyperlipidemia, and Alzheimer's dementia who was recently evaluated for progressive weight loss and melena.  CT of the chest, abdomen, and pelvis on 12/23/2020 revealed wall thickening of the cecum/terminal ileum, at least 6 hepatic metastases, and a right breast mass concerning for metastatic disease.  Family opted not to pursue biopsy.  She was referred to palliative care to help address goals.  SOCIAL HISTORY:     reports that she has never smoked. She has never used smokeless tobacco. She reports that she does not drink alcohol and does not use drugs.  Patient is widowed since 2017.  She lives at home with a son and daughter-in-law.  Patient has 3 sons and a daughter.  She had another son who is now deceased.  ADVANCE DIRECTIVES:  None on file  CODE STATUS: DNR/DNI (DNR form completed on 01/07/2021)  PAST MEDICAL HISTORY: Past Medical History:  Diagnosis Date  . Alzheimer disease ([place])   . Arthritis   . Chronic constipation   . Colon polyp   . Cystitis   . Diverticulosis of colon   . Dyspnea   . Hyperlipidemia   . Hypertension   . Memory loss   . Menopausal disorder   . Parkinson disease ([place])   . Seasonal allergies   . Tremor   . Unsteady gait   . Vitamin D deficiency     PAST SURGICAL HISTORY:  Past Surgical History:  Procedure Laterality Date  . ABDOMINAL HYSTERECTOMY    . CAST APPLICATION Left 54/01/6066   Procedure: CAST APPLICATION;  Surgeon: Thornton Park, MD;  Location: [facility] ORS;  Service: Orthopedics;   Laterality: Left;  . CLOSED REDUCTION WRIST FRACTURE Left 10/09/2019   Procedure: CLOSED REDUCTION WRIST;  Surgeon: Thornton Park, MD;  Location: [facility] ORS;  Service: Orthopedics;  Laterality: Left;    HEMATOLOGY/ONCOLOGY HISTORY:  Oncology History   No history exists.    ALLERGIES:  is allergic to lisinopril and norvasc [amlodipine besylate].  MEDICATIONS:  Current Outpatient Medications  Medication Sig Dispense Refill  . amLODipine (NORVASC) 2.5 MG tablet Take 2.5 mg by mouth daily. (Patient not taking: Reported on 01/06/2021)    . aspirin 81 MG tablet Take 81 mg by mouth daily.    . B Complex-C (SUPER B COMPLEX PO) Take by mouth.    Marland Kitchen CALCIUM-MAGNESIUM-ZINC PO Take by mouth. (Patient not taking: Reported on 01/06/2021)    . Cholecalciferol (VITAMIN D3 SUPER STRENGTH) 50 MCG (2000 UT) TABS Take by mouth.    . Cyanocobalamin (VITAMIN B12) 1000 MCG TBCR Take by mouth.    . ferrous sulfate 325 (65 FE) MG tablet Take 325 mg by mouth daily with breakfast. (Patient not taking: Reported on 01/06/2021)    . furosemide (LASIX) 20 MG tablet Take 20 mg by mouth. (Patient not taking: Reported on 01/06/2021)    . losartan (COZAAR) 100 MG tablet Take 100 mg by mouth daily.    Marland Kitchen lovastatin (MEVACOR) 40 MG tablet Take 40 mg by mouth daily.    . Multiple Vitamin (MULTIVITAMIN) tablet Take 1 tablet by mouth daily.    Marland Kitchen  polyethylene glycol powder (GLYCOLAX/MIRALAX) 17 GM/SCOOP powder Take 17 g by mouth daily. (Patient not taking: Reported on 12/29/2020)    . propranolol ER (INDERAL LA) 80 MG 24 hr capsule Take 80 mg by mouth daily.    . rivastigmine (EXELON) 1.5 MG capsule Take 1.5 mg by mouth daily.    Marland Kitchen senna (SENOKOT) 8.6 MG TABS tablet Take 2 tablets (17.2 mg total) by mouth daily. 120 tablet 0  . zinc gluconate 50 MG tablet Take 50 mg by mouth daily.     No current facility-administered medications for this visit.    VITAL SIGNS: There were no vitals taken for this visit. There were no vitals filed  for this visit.  Estimated body mass index is 22.36 kg/m as calculated from the following:   Height as of 01/06/21: _0  (1.473 m).   Weight as of 01/06/21: 107 lb (48.5 kg).  LABS: CBC:    Component Value Date/Time   WBC 9.7 01/06/2021 0954   HGB 9.1 (L) 01/06/2021 0954   HCT 30.7 (L) 01/06/2021 0954   PLT 335 01/06/2021 0954   MCV 83.9 01/06/2021 0954   Comprehensive Metabolic Panel:    Component Value Date/Time   NA 129 (L) 12/23/2020 1707   K 4.0 12/23/2020 1707   K 3.8 08/11/2013 1435   CL 100 12/23/2020 1707   CO2 22 12/23/2020 1707   BUN 39 (H) 12/23/2020 1707   CREATININE 0.97 12/23/2020 1707   GLUCOSE 109 (H) 12/23/2020 1707   CALCIUM 9.4 12/23/2020 1707    RADIOGRAPHIC STUDIES: CT CHEST ABDOMEN PELVIS W CONTRAST  Result Date: 12/25/2020 CLINICAL DATA:  87 year old female with blood in stools in unintentional weight loss. Anemia. EXAM: CT CHEST, ABDOMEN, AND PELVIS WITH CONTRAST TECHNIQUE: Multidetector CT imaging of the chest, abdomen and pelvis was performed following the standard protocol during bolus administration of intravenous contrast. CONTRAST:  11m OMNIPAQUE IOHEXOL 300 MG/ML  SOLN COMPARISON:  09/23/2020 chest CT and 01/20/2020 chest, abdomen and pelvic CT. FINDINGS: CT CHEST FINDINGS Cardiovascular: Cardiomegaly is again identified. Aortic atherosclerotic calcifications noted without thoracic aortic aneurysm. No pericardial effusion. Mediastinum/Nodes: No mediastinal mass or enlarged lymph nodes. The visualized trachea, thyroid gland and esophagus are unremarkable. Lungs/Pleura: Posterior RIGHT UPPER lobe airspace disease/consolidation has decreased from 09/23/2020. Moderate lingular atelectasis with cylindrical bronchiectasis is unchanged. Streaky/ground-glass opacities within both LOWER lobes are relatively unchanged. No new or enlarging pulmonary opacities are identified. Musculoskeletal: A 1.7 cm INNER RIGHT breast mass is identified. No acute or suspicious  bony abnormalities are noted. CT ABDOMEN PELVIS FINDINGS Hepatobiliary: At least 6 new hepatic masses are identified since 01/20/2020 highly suspicious for metastatic disease. An index mass within the low RIGHT liver measures 2 x 2.5 cm (series 2: Image 61). There is no evidence of biliary dilatation. Cholelithiasis without CT evidence of gallbladder wall thickening or pericholecystic fluid identified. Pancreas: Unremarkable Spleen: Unremarkable Adrenals/Urinary Tract: Bilateral renal atrophy and fullness of both renal collecting systems noted. Bilateral renal cysts are again identified. The adrenal glands and bladder are unremarkable. Stomach/Bowel: There is apparent wall thickening of the cecum/terminal ileum, suspicious for malignancy given blood in stools and metastases within the liver. A few prominent pericecal lymph nodes are noted. There is no evidence of bowel obstruction, pneumoperitoneum or abscess. A moderate amount of colonic stool is noted with a large amount of rectal stool identified. Colonic diverticulosis is identified without evidence of acute diverticulitis. Vascular/Lymphatic: Aortic atherosclerosis. No enlarged abdominal or pelvic lymph nodes. Reproductive: Status post hysterectomy.  No adnexal masses. Other: No ascites Musculoskeletal: No acute or suspicious bony abnormalities are identified. IMPRESSION: 1. Apparent wall thickening of the cecum/terminal ileum, suspicious for malignancy given blood in stools. 2. At least 6 new hepatic masses - highly suspicious for metastatic disease. 3. 1.7 cm INNER RIGHT breast mass indeterminate but suspicious for malignancy. Correlation with diagnostic mammography and ultrasound is recommended as clinically indicated. No abnormal appearing RIGHT axillary lymph nodes. 4. Decreasing RIGHT UPPER lobe airspace disease/consolidation, with unchanged lingular and LOWER lobe atelectasis and cylindrical bronchiectasis. 5. Cholelithiasis without CT evidence of acute  cholecystitis. 6. Aortic Atherosclerosis (ICD10-I70.0). Electronically Signed   By: Margarette Canada M.D.   On: 12/25/2020 13:15    PERFORMANCE STATUS (ECOG) : 2 - Symptomatic, <50% confined to bed  Review of Systems Unless otherwise noted, a complete review of systems is negative.  Physical Exam General: NAD, thin Pulmonary: Unlabored Extremities: no edema, no joint deformities Skin: no rashes Neurological: Weakness, oriented only to person  IMPRESSION: Patient seen in the infusion area prior to her Venofer treatment.  She is confused at baseline and was only oriented to person.  She does not consistently recognize her family.  She was unable to engage meaningfully in conversation regarding goals.  I met with patient's son, daughter-in-law, and daughter.  Patient was sent yesterday for liver biopsy but family ultimately decided to forego work-up as they are not interested in pursuing any cancer treatment.  Instead, they verbalized a desire to keep patient comfortable at home until end-of-life.  We discussed the option of hospice in detail.  Family are familiar with hospice through the end-of-life care of their father and in-laws.  Family verbalized a desire to pursue hospice care at home.  I called in an order for hospice to [facility].   Family okay with foregoing future appointments at [facility].  Patient unlikely to benefit from future Venofer treatments.  Discussed resuming oral iron with Dr. Tasia Catchings but she felt that it would be unlikely to help and recommended against it.  Symptomatically, patient has been constipated but this is improved on MiraLAX and Senokot.  Patient is having several bowel movements a week.  Her oral intake is quite good, per family.  They deny other distressing symptoms.  Patient is reported to have sundowners with worsening confusion in the evening hours.  She chronically has difficulty sleeping and wakes multiple times at night.  Family has tried melatonin without success.   I suggested that we could try either mirtazapine or trazodone but family declined.  Patient's daughter is her healthcare power of attorney.  Patient does not have a living will.  Patient does not have capacity today for medical decision-making.  Family verbalized that they are not interested in patient being resuscitated or having her life prolonged artificially on machines.  They were in agreement with DNR/DNI.  I completed a DNR order for them to take home today.  PLAN: -Best supportive care -Hospice referral at home -Continue daily bowel regimen -Consider trazodone or mirtazapine for sleep if family is interested -Discontinue oral iron -DNR/DNI -RTC as needed  Case and plan discussed with Dr. Tasia Catchings    Time Total: 30 minutes  Visit consisted of counseling and education dealing with the complex and emotionally intense issues of symptom management and palliative care in the setting of serious and potentially life-threatening illness.Greater than 50%  of this time was spent counseling and coordinating care related to the above assessment and plan.  Signed by: Altha Harm, PhD, NP-C

## 2021-01-14 ENCOUNTER — Ambulatory Visit: Payer: Medicare Other | Admitting: Oncology

## 2021-01-14 ENCOUNTER — Ambulatory Visit: Payer: Medicare Other

## 2021-02-01 ENCOUNTER — Emergency Department
Admission: EM | Admit: 2021-02-01 | Discharge: 2021-02-01 | Disposition: A | Discharge status: Home / Self Care | Attending: Emergency Medicine | Admitting: Emergency Medicine

## 2021-02-01 DIAGNOSIS — I10 Essential (primary) hypertension: Secondary | ICD-10-CM | POA: Insufficient documentation

## 2021-02-01 DIAGNOSIS — Z79899 Other long term (current) drug therapy: Secondary | ICD-10-CM | POA: Diagnosis not present

## 2021-02-01 DIAGNOSIS — K922 Gastrointestinal hemorrhage, unspecified: Secondary | ICD-10-CM | POA: Insufficient documentation

## 2021-02-01 DIAGNOSIS — G309 Alzheimer's disease, unspecified: Secondary | ICD-10-CM | POA: Diagnosis not present

## 2021-02-01 DIAGNOSIS — G20 Parkinson's disease: Secondary | ICD-10-CM | POA: Insufficient documentation

## 2021-02-01 DIAGNOSIS — D649 Anemia, unspecified: Secondary | ICD-10-CM | POA: Insufficient documentation

## 2021-02-01 DIAGNOSIS — K625 Hemorrhage of anus and rectum: Secondary | ICD-10-CM | POA: Diagnosis present

## 2021-02-01 DIAGNOSIS — Z7982 Long term (current) use of aspirin: Secondary | ICD-10-CM | POA: Diagnosis not present

## 2021-02-01 DIAGNOSIS — F0280 Dementia in other diseases classified elsewhere without behavioral disturbance: Secondary | ICD-10-CM | POA: Insufficient documentation

## 2021-02-01 LAB — CBC
HCT: 31.9 % — ABNORMAL LOW (ref 36.0–46.0)
Hemoglobin: 9.7 g/dL — ABNORMAL LOW (ref 12.0–15.0)
MCH: 24.7 pg — ABNORMAL LOW (ref 26.0–34.0)
MCHC: 30.4 g/dL (ref 30.0–36.0)
MCV: 81.4 fL (ref 80.0–100.0)
Platelets: 350 10*3/uL (ref 150–400)
RBC: 3.92 MIL/uL (ref 3.87–5.11)
RDW: 18.6 % — ABNORMAL HIGH (ref 11.5–15.5)
WBC: 14.4 10*3/uL — ABNORMAL HIGH (ref 4.0–10.5)
nRBC: 0 % (ref 0.0–0.2)

## 2021-02-01 LAB — COMPREHENSIVE METABOLIC PANEL
ALT: 18 U/L (ref 0–44)
AST: 65 U/L — ABNORMAL HIGH (ref 15–41)
Albumin: 2.2 g/dL — ABNORMAL LOW (ref 3.5–5.0)
Alkaline Phosphatase: 167 U/L — ABNORMAL HIGH (ref 38–126)
Anion gap: 4 — ABNORMAL LOW (ref 5–15)
BUN: 30 mg/dL — ABNORMAL HIGH (ref 8–23)
CO2: 24 mmol/L (ref 22–32)
Calcium: 8.9 mg/dL (ref 8.9–10.3)
Chloride: 108 mmol/L (ref 98–111)
Creatinine, Ser: 0.91 mg/dL (ref 0.44–1.00)
GFR, Estimated: 60 mL/min — ABNORMAL LOW (ref >60)
Glucose, Bld: 126 mg/dL — ABNORMAL HIGH (ref 70–99)
Potassium: 4.7 mmol/L (ref 3.5–5.1)
Sodium: 136 mmol/L (ref 135–145)
Total Bilirubin: 0.9 mg/dL (ref 0.3–1.2)
Total Protein: 6.2 g/dL — ABNORMAL LOW (ref 6.5–8.1)

## 2021-02-01 LAB — PREPARE RBC (CROSSMATCH)

## 2021-02-01 LAB — PROTIME-INR
INR: 1 (ref 0.8–1.2)
Prothrombin Time: 13 seconds (ref 11.4–15.2)

## 2021-02-01 MED ORDER — SODIUM CHLORIDE 0.9 % IV BOLUS
1000.0000 mL | Freq: Once | INTRAVENOUS | Status: AC
  Administered 2021-02-01: 1000 mL via INTRAVENOUS

## 2021-02-01 MED ORDER — SODIUM CHLORIDE 0.9 % IV SOLN
10.0000 mL/h | Freq: Once | INTRAVENOUS | Status: AC
  Administered 2021-02-01: 10 mL/h via INTRAVENOUS

## 2021-02-01 NOTE — ED Provider Notes (Addendum)
[facility] Emergency Department Provider Note   ____________________________________________   Event Date/Time   First MD Initiated Contact with Patient 02/01/21 1607     (approximate)  I have reviewed the triage vital signs and the nursing notes.   HISTORY  Chief Complaint Rectal Bleeding    HPI Cynthia Patterson is a 87 y.o. female with a past medical history of Alzheimer's disease and stage IV colon cancer on hospice who presents for sudden onset of bright red blood per rectum and a subsequent syncopal episode while on the toilet.  Patient daughter at bedside and provides this history.  Denies patient ever having similar symptoms in the past.  Denies any blood thinner use.  Patient does take aspirin 81 daily.  Patient currently denies any continued or active bleeding, abdominal pain, rectal pain, subsequent episodes of syncope         Past Medical History:  Diagnosis Date  . Alzheimer disease ([place])   . Arthritis   . Chronic constipation   . Colon polyp   . Cystitis   . Diverticulosis of colon   . Dyspnea   . Hyperlipidemia   . Hypertension   . Memory loss   . Menopausal disorder   . Parkinson disease ([place])   . Seasonal allergies   . Tremor   . Unsteady gait   . Vitamin D deficiency     Patient Active Problem List   Diagnosis Date Noted  . IDA (iron deficiency anemia) 01/04/2021  . Goals of care, counseling/discussion 12/29/2020  . Liver mass 12/29/2020  . Mass of colon 12/29/2020  . Monoclonal gammopathy 12/29/2020  . Iron deficiency anemia 12/29/2020  . Hypertension 04/26/2015  . 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 HLD (hyperlipidemia) 03/23/2009    Past Surgical History:  Procedure Laterality Date  . ABDOMINAL HYSTERECTOMY    . CAST APPLICATION Left 76/05/3418   Procedure: CAST APPLICATION;  Surgeon: Thornton Park, MD;  Location: [facility] ORS;  Service: Orthopedics;  Laterality: Left;  . CLOSED REDUCTION WRIST  FRACTURE Left 10/09/2019   Procedure: CLOSED REDUCTION WRIST;  Surgeon: Thornton Park, MD;  Location: [facility] ORS;  Service: Orthopedics;  Laterality: Left;    Prior to Admission medications   Medication Sig Start Date End Date Taking? Authorizing Provider  amLODipine (NORVASC) 2.5 MG tablet Take 2.5 mg by mouth daily. Patient not taking: Reported on 01/06/2021    [provider]  aspirin 81 MG tablet Take 81 mg by mouth daily.    [provider]  B Complex-C (SUPER B COMPLEX PO) Take by mouth.    [provider]  CALCIUM-MAGNESIUM-ZINC PO Take by mouth. Patient not taking: Reported on 01/06/2021    [provider]  Cholecalciferol (VITAMIN D3 SUPER STRENGTH) 50 MCG (2000 UT) TABS Take by mouth.    [provider]  Cyanocobalamin (VITAMIN B12) 1000 MCG TBCR Take by mouth.    [provider]  ferrous sulfate 325 (65 FE) MG tablet Take 325 mg by mouth daily with breakfast. Patient not taking: Reported on 01/06/2021    [provider]  furosemide (LASIX) 20 MG tablet Take 20 mg by mouth. Patient not taking: Reported on 01/06/2021    [provider]  losartan (COZAAR) 100 MG tablet Take 100 mg by mouth daily.    [provider]  lovastatin (MEVACOR) 40 MG tablet Take 40 mg by mouth daily. 11/18/20   [provider]  Multiple Vitamin (MULTIVITAMIN) tablet Take 1 tablet by  mouth daily.    [provider]  polyethylene glycol powder (GLYCOLAX/MIRALAX) 17 GM/SCOOP powder Take 17 g by mouth daily. Patient not taking: Reported on 12/29/2020    [provider]  propranolol ER (INDERAL LA) 80 MG 24 hr capsule Take 80 mg by mouth daily. 11/18/20   [provider]  rivastigmine (EXELON) 1.5 MG capsule Take 1.5 mg by mouth daily. 06/15/20   [provider]  senna (SENOKOT) 8.6 MG TABS tablet Take 2 tablets (17.2 mg total) by mouth daily. 12/29/20   Earlie Server, MD  zinc gluconate 50 MG tablet Take  50 mg by mouth daily.    [provider]    Allergies Lisinopril and Norvasc [amlodipine besylate]  Family History  Problem Relation Age of Onset  . Heart failure Mother   . Lung cancer Father   . Cancer Paternal Grandmother   . Hypertension Sister     Social History Social History   Tobacco Use  . Smoking status: Never Smoker  . Smokeless tobacco: Never Used  Substance Use Topics  . Alcohol use: No  . Drug use: No    Review of Systems Unable to assess ____________________________________________   PHYSICAL EXAM:  VITAL SIGNS: ED Triage Vitals  Enc Vitals Group     BP 02/01/21 1616 (!) 156/49     Pulse Rate 02/01/21 1616 (!) 52     Resp 02/01/21 1616 18     Temp 02/01/21 1616 97.9 F (36.6 C)     Temp Source 02/01/21 1616 Oral     SpO2 02/01/21 1616 100 %     Weight 02/01/21 1606 105 lb 13.1 oz (48 kg)     Height 02/01/21 1606 4\' 10"  (1.473 m)     Head Circumference --      Peak Flow --      Pain Score 02/01/21 1605 0     Pain Loc --      Pain Edu? --      Excl. in [place]? --    Constitutional: Alert and oriented only to self. Well appearing cachectic elderly Caucasian female in no acute distress. Eyes: Conjunctivae are pallid. PERRL. Head: Atraumatic. Nose: No congestion/rhinnorhea. Mouth/Throat: Mucous membranes are moist. Neck: No stridor Cardiovascular: Grossly normal heart sounds.  Good peripheral circulation. Respiratory: Normal respiratory effort.  No retractions. Gastrointestinal: Soft and nontender. No distention. Musculoskeletal: No obvious deformities Neurologic:  Normal speech and language. No gross focal neurologic deficits are appreciated. Skin:  Skin is warm and dry. No rash noted.  General pallor Psychiatric: Mood and affect are normal. Speech and behavior are normal.  ____________________________________________   LABS (all labs ordered are listed, but only abnormal results are displayed)  Labs Reviewed  COMPREHENSIVE  METABOLIC PANEL - Abnormal; Notable for the following components:      Result Value   Glucose, Bld 126 (*)    BUN 30 (*)    Total Protein 6.2 (*)    Albumin 2.2 (*)    AST 65 (*)    Alkaline Phosphatase 167 (*)    GFR, Estimated 60 (*)    Anion gap 4 (*)    All other components within normal limits  CBC - Abnormal; Notable for the following components:   WBC 14.4 (*)    Hemoglobin 9.7 (*)    HCT 31.9 (*)    MCH 24.7 (*)    RDW 18.6 (*)    All other components within normal limits  PROTIME-INR  POC OCCULT BLOOD, ED  TYPE AND SCREEN  PREPARE RBC (CROSSMATCH)  TYPE AND SCREEN   ____________________________________________  EKG  ED ECG REPORT I, Naaman Plummer, the attending physician, personally viewed and interpreted this ECG.  Date: 02/01/2021 EKG Time: 1627 Rate: 55 Rhythm: normal sinus rhythm QRS Axis: normal Intervals: normal ST/T Wave abnormalities: normal Narrative Interpretation: no evidence of acute ischemia  PROCEDURES  Procedure(s) performed (including Critical Care):  .Critical Care Performed by: Naaman Plummer, MD Authorized by: Naaman Plummer, MD   Critical care provider statement:    Critical care time (minutes):  25   Critical care time was exclusive of:  Separately billable procedures and treating other patients   Critical care was necessary to treat or prevent imminent or life-threatening deterioration of the following conditions:  Circulatory failure   Critical care was time spent personally by me on the following activities:  Discussions with consultants, evaluation of patient's response to treatment, examination of patient, ordering and performing treatments and interventions, ordering and review of laboratory studies, ordering and review of radiographic studies, pulse oximetry, re-evaluation of patient's condition, obtaining history from patient or surrogate and review of old charts   I assumed direction of critical care for this patient from  another provider in my specialty: no     Care discussed with: admitting provider       ____________________________________________   INITIAL IMPRESSION / ASSESSMENT AND PLAN / ED COURSE  As part of my medical decision making, I reviewed the following data within the [place] notes reviewed and incorporated, Labs reviewed, EKG interpreted, Old chart reviewed, Radiograph reviewed and Notes from prior ED visits reviewed and incorporated        Patient presents for symptomatic anemia secondary to GI bleeding likely from cancer source. Patient with known cause of bleeding and follow up scheduled. Given 1 unit of blood with resolution of symptoms afterwards. Patient had no reaction to blood transfusion.  Patient was given the opportunity for admission given her possibly still active GI bleeding however the patient as well as her daughter at baseline made the shared decision to go home at this time given the patient is on hospice and only wants her to be comfortable.  The family as well as the patient refused the possibility of undergoing any sort of procedures for this bleeding and therefore there is no benefit to admission at this time other than monitoring.  Patient's daughter at bedside states that she will have 24-hour care with family members at bedside and will return if symptoms worsen.  Patient feels well on discharge with plan to follow up with PMD.      ____________________________________________   FINAL CLINICAL IMPRESSION(S) / ED DIAGNOSES  Final diagnoses:  Lower GI bleed  Symptomatic anemia     ED Discharge Orders    None       Note:  This document was prepared using Dragon voice recognition software and may include unintentional dictation errors.   Naaman Plummer, MD 02/01/21 1751    Naaman Plummer, MD 03/04/21 [phone]

## 2021-02-01 NOTE — ED Notes (Signed)
Large amount of bright red blood saturated patient brief and pants, down to her ankles and feet. Patient cleansed and clean gown provided.

## 2021-02-01 NOTE — ED Notes (Signed)
Phlebotomy at bedside.

## 2021-02-01 NOTE — ED Triage Notes (Signed)
Patient arrived from home. Patient diagnosed with stage 4 colon cancer on 01/20/21. Patient had sudden onset of bright red rectal bleeding today, per daughter patient had syncopal episode while on toilet. Patient A&OX1 at baseline. Hx of dementia.

## 2021-02-02 ENCOUNTER — Ambulatory Visit: Payer: Medicare Other | Admitting: Oncology

## 2021-02-02 ENCOUNTER — Other Ambulatory Visit: Payer: Medicare Other

## 2021-02-02 LAB — BPAM RBC
Blood Product Expiration Date: 202204282359
ISSUE DATE / TIME: 202203291854
Unit Type and Rh: 5100

## 2021-02-02 LAB — TYPE AND SCREEN
ABO/RH(D): O POS
Antibody Screen: NEGATIVE
Unit division: 0

## 2021-02-03 ENCOUNTER — Telehealth: Payer: Self-pay | Admitting: Hospice and Palliative Medicine

## 2021-02-03 ENCOUNTER — Ambulatory Visit: Payer: Medicare Other | Admitting: Oncology

## 2021-02-03 ENCOUNTER — Other Ambulatory Visit: Payer: Medicare Other

## 2021-02-03 NOTE — Telephone Encounter (Signed)
Agree with dc aspirin

## 2021-02-03 NOTE — Telephone Encounter (Signed)
I received a call from patient's hospice nurse.  Patient apparently presented to the ER earlier this week with a GI bleed.  This is resolved but hospice question if patient needs to continue taking low-dose aspirin.  Okay to discontinue aspirin.

## 2021-02-09 ENCOUNTER — Ambulatory Visit: Payer: Medicare Other

## 2021-03-30 ENCOUNTER — Telehealth: Payer: Self-pay | Admitting: Hospice and Palliative Medicine

## 2021-03-30 NOTE — Telephone Encounter (Signed)
I received a call from patient's hospice nurse.  Patient has had several days of bilateral lower extremity edema extending into the groin, which is weeping.  Patient was previously taking furosemide 20 mg daily for lower extremity edema.  Okay to resume for several days to see if it helps symptoms.  Increase potassium rich foods.  Nurse to follow-up over the weekend or sooner if needed.

## 2021-04-07 ENCOUNTER — Other Ambulatory Visit: Payer: Self-pay | Admitting: Hospice and Palliative Medicine

## 2021-04-07 MED ORDER — MORPHINE SULFATE (CONCENTRATE) 10 MG /0.5 ML PO SOLN: 5.0000 mg | ORAL | 0 refills | Status: AC | PRN

## 2021-04-07 NOTE — Progress Notes (Signed)
Hospice called and requested an order for morphine elixir.  Plan, patient is actively terminal phase.  Will send Rx #30 mL

## 2021-05-06 DEATH — deceased

## 2022-01-29 IMAGING — CT CT CHEST-ABD-PELV W/ CM
2 of 5 series · 12 of 36 positions shown, 14 images · IV contrast (omnipaque)
Comparison: 09/23/[address] chest CT and 01/20/2020 chest, abdomen and
pelvic CT.

CLINICAL DATA: [AGE] female with blood in stools in
unintentional weight loss. Anemia.

EXAM:
CT CHEST, ABDOMEN, AND PELVIS WITH CONTRAST
TECHNIQUE: Multidetector CT imaging of the chest, abdomen and pelvis was
performed following the standard protocol during bolus
administration of intravenous contrast.
CONTRAST:  75mL OMNIPAQUE IOHEXOL 300 MG/ML  SOLN

[Series 2: cap with · axial · 0.62mm/px · z∈[-352,+83]mm · 9 of 109 slices shown, 11 images]
[im 11/109  mediastinal]
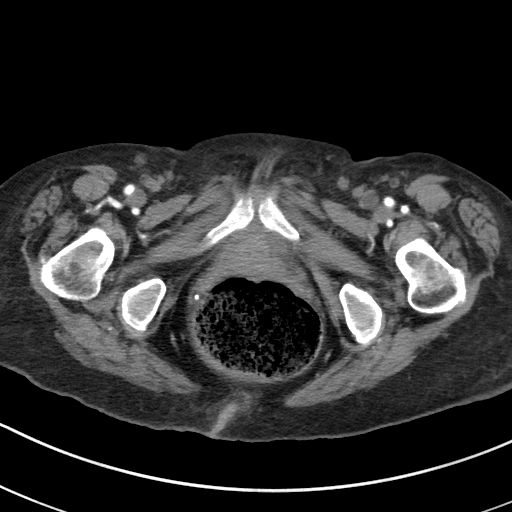
[im 11/109  bone]
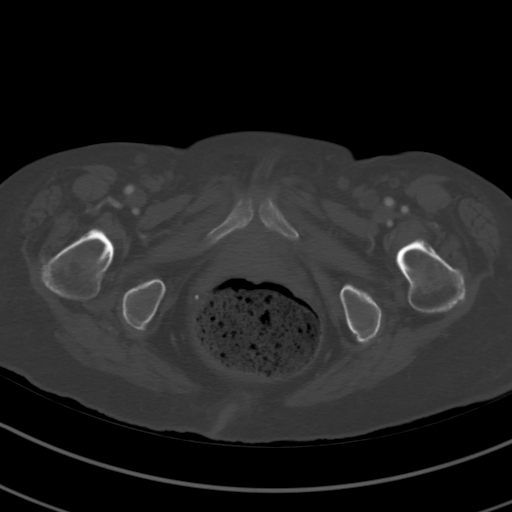
[im 22/109  mediastinal]
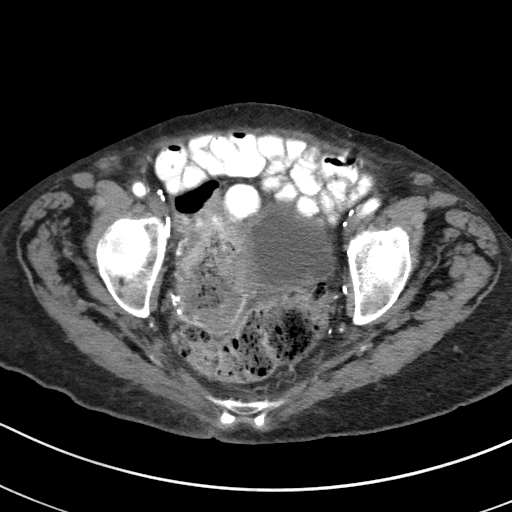
[im 33/109  mediastinal]
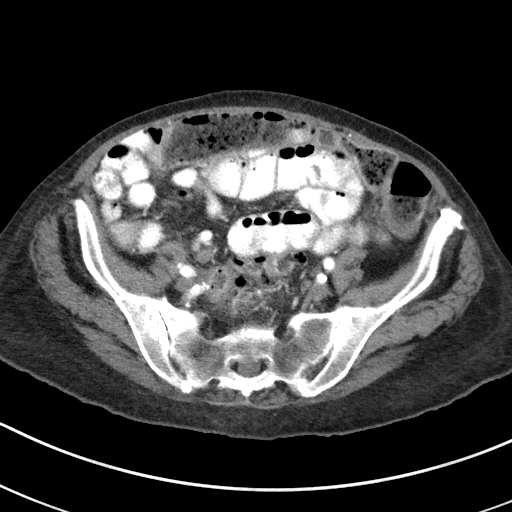
[im 44/109  mediastinal]
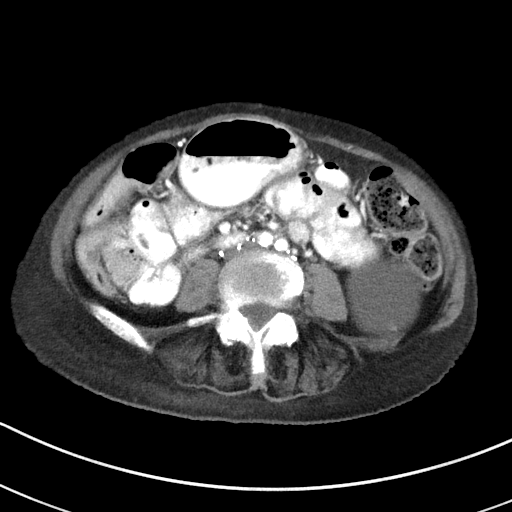
[im 55/109  mediastinal]
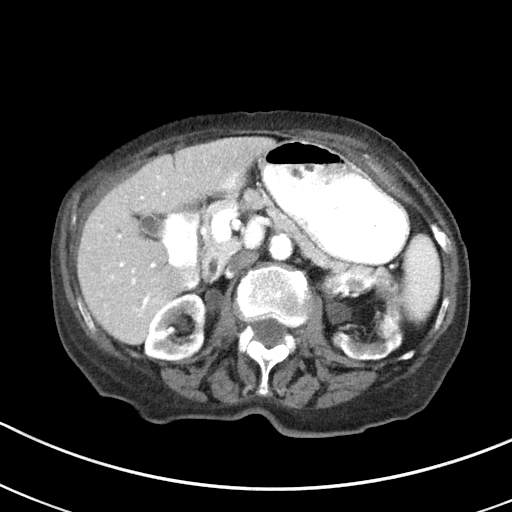
[im 65/109  mediastinal]
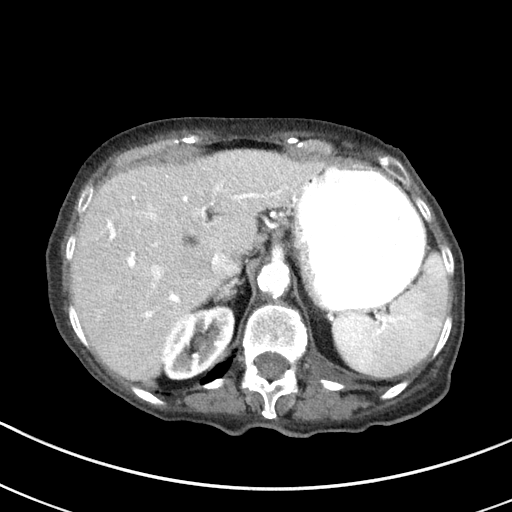
[im 76/109  mediastinal]
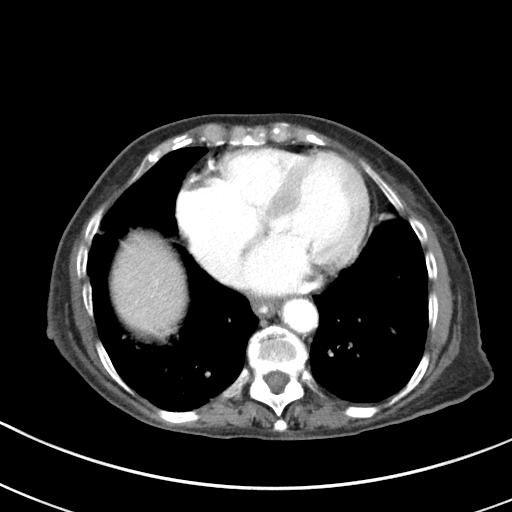
[im 87/109  mediastinal]
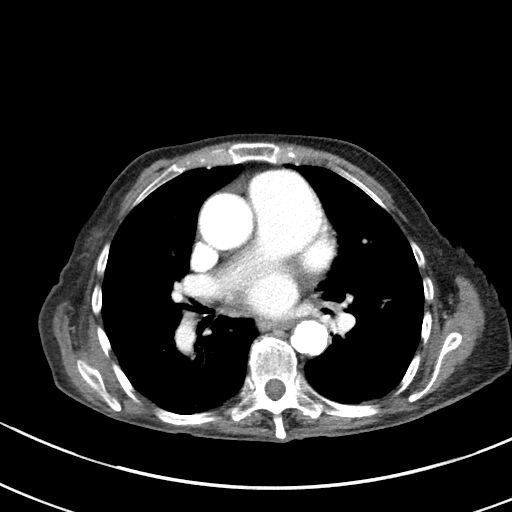
[im 98/109  mediastinal]
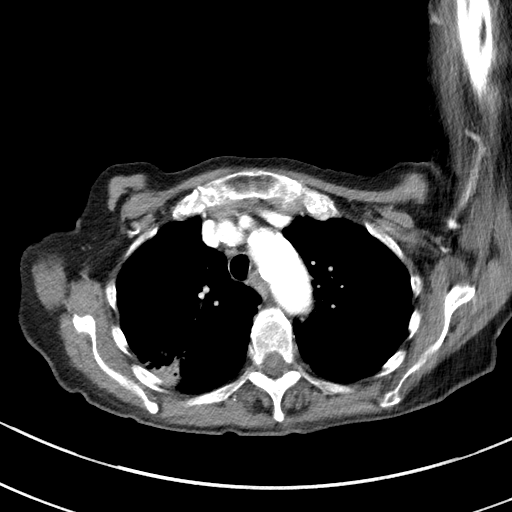
[im 98/109  bone]
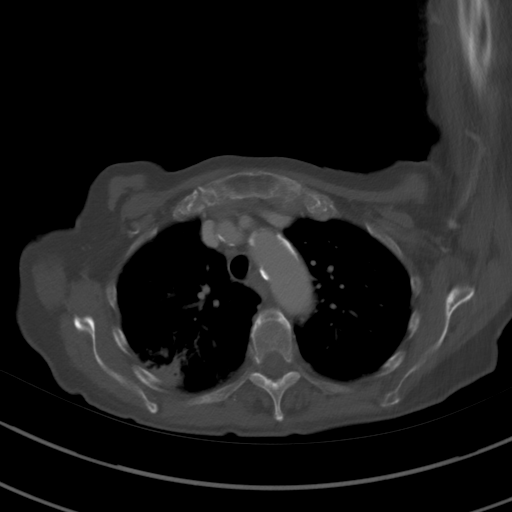

[Series 5: coronals · coronal · 0.53mm/px · 3 of 109 slices shown]
[im 22/109  mediastinal]
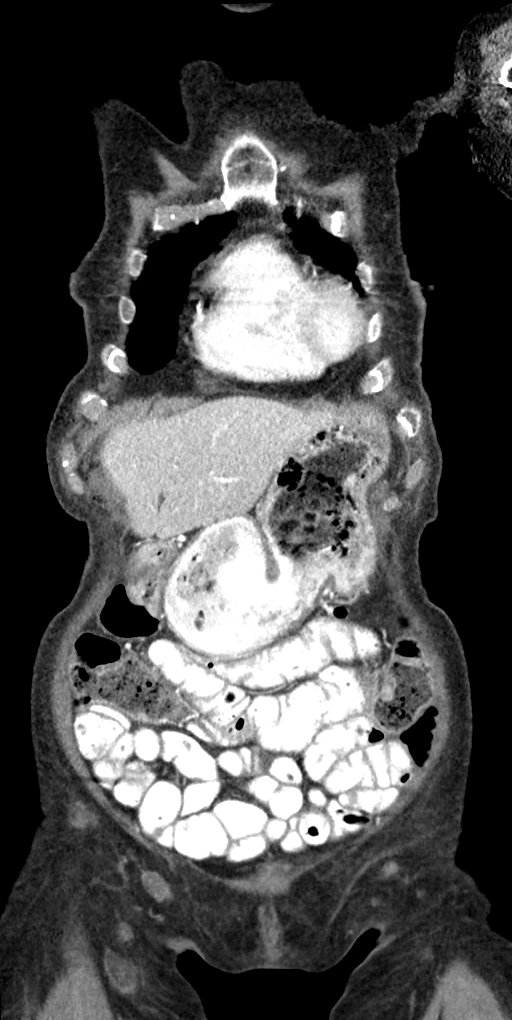
[im 44/109  mediastinal]
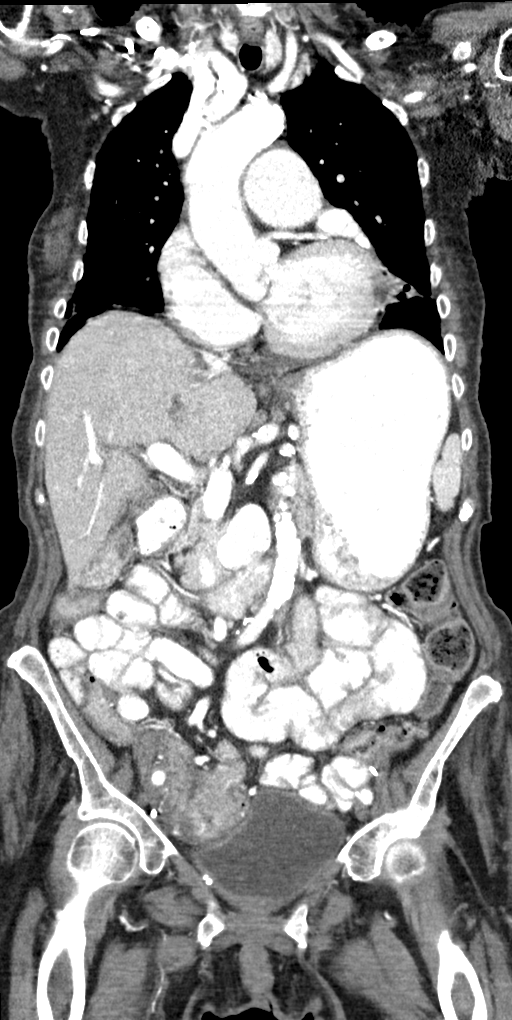
[im 65/109  mediastinal]
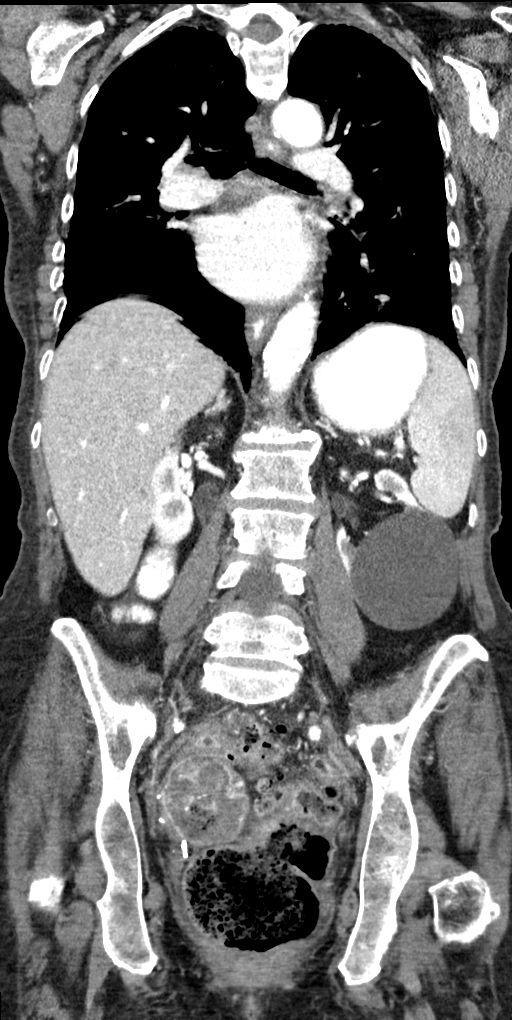

[12 of 36 positions shown; findings below may reference images not displayed]

FINDINGS: CT CHEST FINDINGS

Cardiovascular: Cardiomegaly is again identified. Aortic
atherosclerotic calcifications noted without thoracic aortic
aneurysm. No pericardial effusion.

Mediastinum/Nodes: No mediastinal mass or enlarged lymph nodes. The
visualized trachea, thyroid gland and esophagus are unremarkable.

Lungs/Pleura: Posterior RIGHT UPPER lobe airspace
disease/consolidation has decreased from 09/23/2020. Moderate
lingular atelectasis with cylindrical bronchiectasis is unchanged.
Streaky/ground-glass opacities within both LOWER lobes are
relatively unchanged. No new or enlarging pulmonary opacities are
identified.

Musculoskeletal: A 1.7 cm INNER RIGHT breast mass is identified. No
acute or suspicious bony abnormalities are noted.

CT ABDOMEN PELVIS FINDINGS

Hepatobiliary: At least 6 new hepatic masses are identified since
01/20/2020 highly suspicious for metastatic disease. An index mass
within the low RIGHT liver measures 2 x 2.5 cm (series 2: Image 61).
There is no evidence of biliary dilatation. Cholelithiasis without
CT evidence of gallbladder wall thickening or pericholecystic fluid
identified.

Pancreas: Unremarkable

Spleen: Unremarkable

Adrenals/Urinary Tract: Bilateral renal atrophy and fullness of both
renal collecting systems noted. Bilateral renal cysts are again
identified. The adrenal glands and bladder are unremarkable.

Stomach/Bowel: There is apparent wall thickening of the
cecum/terminal ileum, suspicious for malignancy given blood in
stools and metastases within the liver. A few prominent pericecal
lymph nodes are noted.

There is no evidence of bowel obstruction, pneumoperitoneum or
abscess.

A moderate amount of colonic stool is noted with a large amount of
rectal stool identified. Colonic diverticulosis is identified
without evidence of acute diverticulitis.

Vascular/Lymphatic: Aortic atherosclerosis. No enlarged abdominal or
pelvic lymph nodes.

Reproductive: Status post hysterectomy. No adnexal masses.

Other: No ascites

Musculoskeletal: No acute or suspicious bony abnormalities are
identified.
IMPRESSION: 1. Apparent wall thickening of the cecum/terminal ileum, suspicious
for malignancy given blood in stools.
2. At least 6 new hepatic masses - highly suspicious for metastatic
disease.
3. 1.7 cm INNER RIGHT breast mass indeterminate but suspicious for
malignancy. Correlation with diagnostic mammography and ultrasound
is recommended as clinically indicated. No abnormal appearing RIGHT
axillary lymph nodes.
4. Decreasing RIGHT UPPER lobe airspace disease/consolidation, with
unchanged lingular and LOWER lobe atelectasis and cylindrical
bronchiectasis.
5. Cholelithiasis without CT evidence of acute cholecystitis.
6. Aortic Atherosclerosis ([id number]).
